# Patient Record
Sex: Male | Born: 1975 | Race: White | Hispanic: No | Marital: Single | Smoking: Current every day smoker
Health system: Southern US, Community
[De-identification: ages and names within clinical notes are randomized; demographics above are authoritative.]

## PROBLEM LIST (undated history)

## (undated) DIAGNOSIS — I8500 Esophageal varices without bleeding: Secondary | ICD-10-CM

## (undated) DIAGNOSIS — K7460 Unspecified cirrhosis of liver: Secondary | ICD-10-CM

## (undated) DIAGNOSIS — B1920 Unspecified viral hepatitis C without hepatic coma: Secondary | ICD-10-CM

## (undated) DIAGNOSIS — K219 Gastro-esophageal reflux disease without esophagitis: Secondary | ICD-10-CM

## (undated) DIAGNOSIS — B182 Chronic viral hepatitis C: Secondary | ICD-10-CM

## (undated) HISTORY — DX: Unspecified viral hepatitis C without hepatic coma: B19.20

## (undated) HISTORY — DX: Unspecified cirrhosis of liver: K74.60

## (undated) HISTORY — DX: Chronic viral hepatitis C: B18.2

## (undated) SURGERY — ESOPHAGOGASTRODUODENOSCOPY (EGD) WITH PROPOFOL
Anesthesia: Monitor Anesthesia Care

---

## 2011-02-05 DEATH — deceased

## 2014-01-08 ENCOUNTER — Other Ambulatory Visit: Payer: Self-pay | Admitting: Family Medicine

## 2014-01-08 DIAGNOSIS — R945 Abnormal results of liver function studies: Principal | ICD-10-CM

## 2014-01-08 DIAGNOSIS — R7989 Other specified abnormal findings of blood chemistry: Secondary | ICD-10-CM

## 2014-01-25 ENCOUNTER — Other Ambulatory Visit: Payer: Self-pay

## 2014-02-05 ENCOUNTER — Ambulatory Visit
Admission: RE | Admit: 2014-02-05 | Discharge: 2014-02-05 | Disposition: A | Discharge status: Home / Self Care | Payer: BC Managed Care – PPO | Source: Ambulatory Visit | Attending: Family Medicine | Admitting: Family Medicine

## 2014-02-05 DIAGNOSIS — R7989 Other specified abnormal findings of blood chemistry: Secondary | ICD-10-CM

## 2014-02-05 DIAGNOSIS — R945 Abnormal results of liver function studies: Principal | ICD-10-CM

## 2016-03-17 ENCOUNTER — Emergency Department (HOSPITAL_COMMUNITY): Payer: Self-pay

## 2016-03-17 ENCOUNTER — Inpatient Hospital Stay (HOSPITAL_COMMUNITY)
Admission: EM | Admit: 2016-03-17 | Discharge: 2016-03-19 | DRG: 369 | Disposition: A | Discharge status: Home / Self Care | Payer: Self-pay | Attending: Internal Medicine | Admitting: Internal Medicine

## 2016-03-17 ENCOUNTER — Encounter (HOSPITAL_COMMUNITY): Payer: Self-pay | Admitting: Emergency Medicine

## 2016-03-17 DIAGNOSIS — K3189 Other diseases of stomach and duodenum: Secondary | ICD-10-CM | POA: Diagnosis present

## 2016-03-17 DIAGNOSIS — B1920 Unspecified viral hepatitis C without hepatic coma: Secondary | ICD-10-CM | POA: Diagnosis present

## 2016-03-17 DIAGNOSIS — Z87891 Personal history of nicotine dependence: Secondary | ICD-10-CM

## 2016-03-17 DIAGNOSIS — D649 Anemia, unspecified: Secondary | ICD-10-CM | POA: Diagnosis present

## 2016-03-17 DIAGNOSIS — E871 Hypo-osmolality and hyponatremia: Secondary | ICD-10-CM | POA: Diagnosis present

## 2016-03-17 DIAGNOSIS — R188 Other ascites: Secondary | ICD-10-CM | POA: Diagnosis present

## 2016-03-17 DIAGNOSIS — R161 Splenomegaly, not elsewhere classified: Secondary | ICD-10-CM

## 2016-03-17 DIAGNOSIS — K766 Portal hypertension: Secondary | ICD-10-CM | POA: Diagnosis present

## 2016-03-17 DIAGNOSIS — D696 Thrombocytopenia, unspecified: Secondary | ICD-10-CM | POA: Diagnosis present

## 2016-03-17 DIAGNOSIS — K922 Gastrointestinal hemorrhage, unspecified: Secondary | ICD-10-CM | POA: Diagnosis present

## 2016-03-17 DIAGNOSIS — R162 Hepatomegaly with splenomegaly, not elsewhere classified: Secondary | ICD-10-CM | POA: Diagnosis present

## 2016-03-17 DIAGNOSIS — I8501 Esophageal varices with bleeding: Principal | ICD-10-CM | POA: Diagnosis present

## 2016-03-17 DIAGNOSIS — R112 Nausea with vomiting, unspecified: Secondary | ICD-10-CM

## 2016-03-17 DIAGNOSIS — R197 Diarrhea, unspecified: Secondary | ICD-10-CM

## 2016-03-17 DIAGNOSIS — K0889 Other specified disorders of teeth and supporting structures: Secondary | ICD-10-CM | POA: Diagnosis present

## 2016-03-17 DIAGNOSIS — D62 Acute posthemorrhagic anemia: Secondary | ICD-10-CM | POA: Diagnosis present

## 2016-03-17 DIAGNOSIS — K7460 Unspecified cirrhosis of liver: Secondary | ICD-10-CM | POA: Diagnosis present

## 2016-03-17 LAB — CBC
HCT: 24.7 % — ABNORMAL LOW (ref 39.0–52.0)
Hemoglobin: 7.8 g/dL — ABNORMAL LOW (ref 13.0–17.0)
MCH: 33.5 pg (ref 26.0–34.0)
MCHC: 31.6 g/dL (ref 30.0–36.0)
MCV: 106 fL — ABNORMAL HIGH (ref 78.0–100.0)
PLATELETS: 137 10*3/uL — AB (ref 150–400)
RBC: 2.33 MIL/uL — ABNORMAL LOW (ref 4.22–5.81)
RDW: 21.8 % — AB (ref 11.5–15.5)
WBC: 11.4 10*3/uL — ABNORMAL HIGH (ref 4.0–10.5)

## 2016-03-17 LAB — LIPASE, BLOOD: Lipase: 162 U/L — ABNORMAL HIGH (ref 11–51)

## 2016-03-17 LAB — SALICYLATE LEVEL: Salicylate Lvl: <7 mg/dL (ref 2.8–30.0)

## 2016-03-17 LAB — PROTIME-INR
INR: 1.27
Prothrombin Time: 16 seconds — ABNORMAL HIGH (ref 11.4–15.2)

## 2016-03-17 LAB — ACETAMINOPHEN LEVEL

## 2016-03-17 LAB — URINALYSIS, ROUTINE W REFLEX MICROSCOPIC
BILIRUBIN URINE: NEGATIVE
Glucose, UA: NEGATIVE mg/dL
HGB URINE DIPSTICK: NEGATIVE
Ketones, ur: NEGATIVE mg/dL
Leukocytes, UA: NEGATIVE
Nitrite: NEGATIVE
PROTEIN: NEGATIVE mg/dL
Specific Gravity, Urine: 1.02 (ref 1.005–1.030)
pH: 5.5 (ref 5.0–8.0)

## 2016-03-17 LAB — DIFFERENTIAL
Basophils Absolute: 0 10*3/uL (ref 0.0–0.1)
Basophils Relative: 0 %
EOS PCT: 1 %
Eosinophils Absolute: 0.1 10*3/uL (ref 0.0–0.7)
LYMPHS ABS: 1.6 10*3/uL (ref 0.7–4.0)
LYMPHS PCT: 14 %
MONO ABS: 1.5 10*3/uL — AB (ref 0.1–1.0)
Monocytes Relative: 13 %
NEUTROS ABS: 8.1 10*3/uL — AB (ref 1.7–7.7)
NEUTROS PCT: 72 %

## 2016-03-17 LAB — COMPREHENSIVE METABOLIC PANEL
ALBUMIN: 2.6 g/dL — AB (ref 3.5–5.0)
ALK PHOS: 71 U/L (ref 38–126)
ALT: 66 U/L — AB (ref 17–63)
AST: 49 U/L — ABNORMAL HIGH (ref 15–41)
Anion gap: 6 (ref 5–15)
BUN: 20 mg/dL (ref 6–20)
CALCIUM: 7.2 mg/dL — AB (ref 8.9–10.3)
CO2: 30 mmol/L (ref 22–32)
CREATININE: 0.98 mg/dL (ref 0.61–1.24)
Chloride: 93 mmol/L — ABNORMAL LOW (ref 101–111)
GFR calc non Af Amer: >60 mL/min (ref >60)
GLUCOSE: 161 mg/dL — AB (ref 65–99)
Potassium: 3.3 mmol/L — ABNORMAL LOW (ref 3.5–5.1)
SODIUM: 129 mmol/L — AB (ref 135–145)
Total Bilirubin: 1.8 mg/dL — ABNORMAL HIGH (ref 0.3–1.2)
Total Protein: 5.4 g/dL — ABNORMAL LOW (ref 6.5–8.1)

## 2016-03-17 LAB — ETHANOL

## 2016-03-17 LAB — ABO/RH: ABO/RH(D): B POS

## 2016-03-17 LAB — AMMONIA: AMMONIA: 17 umol/L (ref 9–35)

## 2016-03-17 LAB — PREPARE RBC (CROSSMATCH)

## 2016-03-17 MED ORDER — IOPAMIDOL (ISOVUE-300) INJECTION 61%
INTRAVENOUS | Status: AC
  Administered 2016-03-17: 30 mL
  Filled 2016-03-17: qty 30

## 2016-03-17 MED ORDER — PANTOPRAZOLE SODIUM 40 MG IV SOLR
40.0000 mg | Freq: Two times a day (BID) | INTRAVENOUS | Status: DC
  Administered 2016-03-18 (×2): 40 mg via INTRAVENOUS
  Filled 2016-03-17 (×2): qty 40

## 2016-03-17 MED ORDER — SODIUM CHLORIDE 0.9 % IV SOLN
INTRAVENOUS | Status: DC
  Administered 2016-03-17 – 2016-03-18 (×2): via INTRAVENOUS

## 2016-03-17 MED ORDER — BOOST / RESOURCE BREEZE PO LIQD
1.0000 | Freq: Three times a day (TID) | ORAL | Status: DC
  Administered 2016-03-18 (×2): 1 via ORAL

## 2016-03-17 MED ORDER — ACETAMINOPHEN 650 MG RE SUPP: 650.0000 mg | Freq: Four times a day (QID) | RECTAL | Status: DC | PRN

## 2016-03-17 MED ORDER — IOPAMIDOL (ISOVUE-300) INJECTION 61%
100.0000 mL | Freq: Once | INTRAVENOUS | Status: AC | PRN
  Administered 2016-03-17: 100 mL via INTRAVENOUS

## 2016-03-17 MED ORDER — PANTOPRAZOLE SODIUM 40 MG IV SOLR: 40.0000 mg | Freq: Two times a day (BID) | INTRAVENOUS | Status: DC

## 2016-03-17 MED ORDER — ACETAMINOPHEN 325 MG PO TABS: 650.0000 mg | ORAL_TABLET | Freq: Four times a day (QID) | ORAL | Status: DC | PRN

## 2016-03-17 MED ORDER — SODIUM CHLORIDE 0.9 % IV SOLN
80.0000 mg | Freq: Once | INTRAVENOUS | Status: AC
  Administered 2016-03-17 (×2): 80 mg via INTRAVENOUS
  Filled 2016-03-17: qty 80

## 2016-03-17 MED ORDER — SODIUM CHLORIDE 0.9 % IV SOLN
8.0000 mg/h | INTRAVENOUS | Status: DC
  Administered 2016-03-17: 8 mg/h via INTRAVENOUS
  Filled 2016-03-17 (×5): qty 80

## 2016-03-17 MED ORDER — ONDANSETRON HCL 4 MG/2ML IJ SOLN: 4.0000 mg | Freq: Four times a day (QID) | INTRAMUSCULAR | Status: DC | PRN

## 2016-03-17 MED ORDER — SODIUM CHLORIDE 0.9 % IV SOLN
INTRAVENOUS | Status: AC
  Administered 2016-03-18: 02:00:00 via INTRAVENOUS

## 2016-03-17 MED ORDER — SODIUM CHLORIDE 0.9 % IV BOLUS (SEPSIS)
1000.0000 mL | Freq: Once | INTRAVENOUS | Status: AC
  Administered 2016-03-17: 1000 mL via INTRAVENOUS

## 2016-03-17 MED ORDER — DICYCLOMINE HCL 10 MG/ML IM SOLN
20.0000 mg | Freq: Once | INTRAMUSCULAR | Status: AC
  Administered 2016-03-17: 20 mg via INTRAMUSCULAR
  Filled 2016-03-17: qty 2

## 2016-03-17 MED ORDER — PANTOPRAZOLE SODIUM 40 MG IV SOLR
INTRAVENOUS | Status: AC
Start: 2016-03-17 — End: 2016-03-17
  Filled 2016-03-17: qty 80

## 2016-03-17 MED ORDER — ONDANSETRON HCL 4 MG PO TABS: 4.0000 mg | ORAL_TABLET | Freq: Four times a day (QID) | ORAL | Status: DC | PRN

## 2016-03-17 MED ORDER — SODIUM CHLORIDE 0.9% FLUSH
3.0000 mL | Freq: Two times a day (BID) | INTRAVENOUS | Status: DC
  Administered 2016-03-18 (×2): 3 mL via INTRAVENOUS

## 2016-03-17 MED ORDER — ONDANSETRON HCL 4 MG/2ML IJ SOLN: 4.0000 mg | Freq: Three times a day (TID) | INTRAMUSCULAR | Status: AC | PRN

## 2016-03-17 NOTE — ED Notes (Signed)
Pt refusing second IV start, after one failed attempt. Explained to patient that he needed a second IV because he needs blood products and Protonix gtt which can not run together. Pt still refusing second IV at this time. Pt uncooperative and agitated. Will check back

## 2016-03-17 NOTE — H&P (Signed)
History and Physical  Benjamin MareDavid Rooks QMV:[id number] DOB: 1975-12-19 DOA: 03/17/2016  Referring physician: Dr Clarene DukeMcManus, ED physician [facility]  Outpatient Specialists: none  Chief Complaint: Abdominal distention  HPI: Benjamin MareDavid Miller is a 40 y.o. male with a possible history of hepatitis C who's been having abdominal distention for 10 days with nausea and vomiting that started a week ago. The patient took Pepto-Bismol intimately threw it up and had diarrhea. He had diarrhea for 2-3 days that was black in color. The patient took Imodium which resolved his diarrhea and the dark stools. He continues to have abdominal distention and came to the hospital with his wife. No other palliating or provoking factors. He denies nausea, vomiting, diarrhea currently. No fevers or chills. Denies alcohol use. He does not take any medications. He does relate that at one point he was diagnosed with hepatitis C, although as this was never confirmed and he has never been treated.  Emergency Department Course: Patient remained without vomiting or diarrhea in the ER. He does have mildly elevated transaminases with a CT that shows hepatosplenomegaly with moderate ascites and secondary signs of possible portal hypertension. Was also found to be anemic with a hemoglobin of 7.8. No active hematemesis or melena.  Review of Systems:   Pt denies any fevers, chills, nausea, vomiting, diarrhea, constipation, abdominal pain, shortness of breath, dyspnea on exertion, orthopnea, cough, wheezing, palpitations, headache, vision changes, lightheadedness, dizziness, melena, rectal bleeding.  Review of systems are otherwise negative  History reviewed. No pertinent past medical history. History reviewed. No pertinent surgical history. Social History:  reports that he has quit smoking. He has quit using smokeless tobacco. He reports that he does not drink alcohol. His drug history is not on file. Patient lives at  home  No Known Allergies  No family history on file.    Prior to Admission medications   Medication Sig Start Date End Date Taking? Authorizing Provider  aspirin-sod bicarb-citric acid (ALKA-SELTZER) 325 MG TBEF tablet Take 325 mg by mouth every 6 (six) hours as needed.   Yes Historical Provider, MD    Physical Exam: BP 124/72 (BP Location: Left Arm)   Pulse 93   Temp 98.6 F (37 C) (Oral)   Resp 18   Ht 5' 11.5" (1.816 m)   Wt 81.6 kg (180 lb)   SpO2 99%   BMI 24.76 kg/m   General: Middle-aged Caucasian male. Awake and alert and oriented x3. No acute cardiopulmonary distress.  HEENT: Normocephalic atraumatic.  Right and left ears normal in appearance.  Pupils equal, round, reactive to light. Extraocular muscles are intact. Sclerae anicteric and noninjected.  Moist mucosal membranes. No mucosal lesions.  Neck: Neck supple without lymphadenopathy. No carotid bruits. No masses palpated.  Cardiovascular: Regular rate with normal S1-S2 sounds. No murmurs, rubs, gallops auscultated. No JVD.  Respiratory: Good respiratory effort with no wheezes, rales, rhonchi. Lungs clear to auscultation bilaterally.  No accessory muscle use. Abdomen: Soft, nontender. Abdomen distended. hepatosplenomegaly . Bowel sounds appropriate Skin: Pale. No rashes, lesions, or ulcerations.  Dry, warm to touch. 2+ dorsalis pedis and radial pulses. Musculoskeletal: No calf or leg pain. All major joints not erythematous nontender.  No upper or lower joint deformation.  Good ROM.  No contractures  Psychiatric: Intact judgment and insight. Pleasant and cooperative. Neurologic: No focal neurological deficits. Strength is 5/5 and symmetric in upper and lower extremities.  Cranial nerves II through XII are grossly intact.  Labs on Admission: I have personally reviewed following labs and imaging studies  CBC:  Recent Labs Lab 03/17/16 1613  WBC 11.4*  NEUTROABS 8.1*  HGB 7.8*  HCT 24.7*  MCV 106.0*    PLT 137*   Basic Metabolic Panel:  Recent Labs Lab 03/17/16 1613  NA 129*  K 3.3*  CL 93*  CO2 30  GLUCOSE 161*  BUN 20  CREATININE 0.98  CALCIUM 7.2*   GFR: Estimated Creatinine Clearance: 107.3 mL/min (by C-G formula based on SCr of 0.98 mg/dL). Liver Function Tests:  Recent Labs Lab 03/17/16 1613  AST 49*  ALT 66*  ALKPHOS 71  BILITOT 1.8*  PROT 5.4*  ALBUMIN 2.6*    Recent Labs Lab 03/17/16 1613  LIPASE 162*    Recent Labs Lab 03/17/16 1856  AMMONIA 17   Coagulation Profile:  Recent Labs Lab 03/17/16 1856  INR 1.27   Cardiac Enzymes: No results for input(s): CKTOTAL, CKMB, CKMBINDEX, TROPONINI in the last 168 hours. BNP (last 3 results) No results for input(s): PROBNP in the last 8760 hours. HbA1C: No results for input(s): HGBA1C in the last 72 hours. CBG: No results for input(s): GLUCAP in the last 168 hours. Lipid Profile: No results for input(s): CHOL, HDL, LDLCALC, TRIG, CHOLHDL, LDLDIRECT in the last 72 hours. Thyroid Function Tests: No results for input(s): TSH, T4TOTAL, FREET4, T3FREE, THYROIDAB in the last 72 hours. Anemia Panel: No results for input(s): VITAMINB12, FOLATE, FERRITIN, TIBC, IRON, RETICCTPCT in the last 72 hours. Urine analysis:    Component Value Date/Time   COLORURINE YELLOW 03/17/2016 1517   APPEARANCEUR HAZY (A) 03/17/2016 1517   LABSPEC 1.020 03/17/2016 1517   PHURINE 5.5 03/17/2016 1517   GLUCOSEU NEGATIVE 03/17/2016 1517   HGBUR NEGATIVE 03/17/2016 1517   BILIRUBINUR NEGATIVE 03/17/2016 1517   KETONESUR NEGATIVE 03/17/2016 1517   PROTEINUR NEGATIVE 03/17/2016 1517   NITRITE NEGATIVE 03/17/2016 1517   LEUKOCYTESUR NEGATIVE 03/17/2016 1517   Sepsis Labs: @LABRCNTIP (procalcitonin:4,lacticidven:4) )No results found for this or any previous visit (from the past 240 hour(s)).   Radiological Exams on Admission: Dg Chest 2 View  Result Date: 03/17/2016 CLINICAL DATA:  Patient with the flu.  Abdominal  distension. EXAM: CHEST  2 VIEW COMPARISON:  None. FINDINGS: The heart size and mediastinal contours are within normal limits. Both lungs are clear. The visualized skeletal structures are unremarkable. IMPRESSION: No active cardiopulmonary disease. Electronically Signed   By: Annia Belt M.D.   On: 03/17/2016 18:11   Ct Abdomen Pelvis W Contrast  Result Date: 03/17/2016 CLINICAL DATA:  Patient with diarrhea, vomiting. History of implanted. EXAM: CT ABDOMEN AND PELVIS WITH CONTRAST TECHNIQUE: Multidetector CT imaging of the abdomen and pelvis was performed using the standard protocol following bolus administration of intravenous contrast. CONTRAST:  30mL ISOVUE-300 IOPAMIDOL (ISOVUE-300) INJECTION 61%, ISOVUE-300 IOPAMIDOL (ISOVUE-300) INJECTION 61% COMPARISON:  None. FINDINGS: Lower chest: Normal heart size. No consolidative opacities within the lower lobes bilaterally. No pleural effusion. Hepatobiliary: Liver is small and nodular in contour compatible with cirrhosis. Too small to characterize sub cm low-attenuation lesion within the left hepatic lobe (image 15; series 2). Wall thickening of the gallbladder. No intrahepatic or extrahepatic biliary ductal dilatation. Pancreas: Unremarkable Spleen: Spleen is enlarged measuring 14 cm. Adrenals/Urinary Tract: The adrenal glands are normal. There is a 1.9 cm cyst within the interpolar region the right kidney. Urinary bladder is unremarkable. Stomach/Bowel: Oral contrast material is demonstrated throughout the small and large bowel without evidence for obstruction. There is suggestion  of wall thickening of the small bowel as well as ascending colon, potentially secondary to portal colopathy and/or low albumin. Vascular/Lymphatic: Normal caliber abdominal aorta. No retroperitoneal lymphadenopathy. Reproductive: Prostate is mildly enlarged. Other: There is moderate volume ascites throughout the abdomen. Musculoskeletal: No aggressive or acute appearing osseous  lesions. IMPRESSION: Findings most compatible with hepatic cirrhosis and associated splenomegaly. There is moderate to large volume ascites throughout the abdomen. Wall thickening of the small bowel and ascending colon is likely secondary to portal hypertension and/or low albumin. Gallbladder wall thickening likely secondary to hepatic parenchymal disease. Recommend correlation with LFTs is clinically indicated. Electronically Signed   By: Annia Beltrew  Davis M.D.   On: 03/17/2016 18:21    Assessment/Plan: Principal Problem:   Acute upper GI bleeding Active Problems:   Cirrhosis of liver with ascites (HCC)   Symptomatic anemia   Hyponatremia   Portal hypertension (HCC)    This patient was discussed with the ED physician, including pertinent vitals, physical exam findings, labs, and imaging.  We also discussed care given by the ED provider.  #1 upper GI bleed  Admit to stepdown  May have esophageal varices if he has oral hypertension  Type and crossed matched  Protonix ordered  GI consulted  We'll keep on clears #2 symptomatic anemia  Transfuse 1 unit with 2 units on hold #3 cirrhosis of liver with ascites  Cirrhosis workup in process  Ultrasound tomorrow #4 hyponatremia  Secondary to cirrhosis #5 portal hypertension   DVT prophylaxis: SCDs Consultants: GI Code Status: Full code Family Communication: Wife in the room  Disposition Plan: Patient to return home following admission   Levie HeritageJacob J Stinson, DO [facility] Pager [phone]  If 7PM-7AM, please contact night-coverage [email or web address] Password [email or web address]

## 2016-03-17 NOTE — ED Provider Notes (Signed)
[facility]-EMERGENCY DEPT Provider Note   CSN: [id number] Arrival date & time: 03/17/16  1506     History   Chief Complaint Chief Complaint  Patient presents with  . Abdominal Pain    HPI Benjamin Miller is a 40 y.o. male.  HPI  Pt was seen at 1625. Per pt, c/o gradual onset and persistence of multiple intermittent episodes of N/V/D that began 2 weeks ago.   Describes the emesis and stools as "black." Has been associated with generalized abd "pain" for the past 3 days. States the abd pain started "after I took imodium" but "the vomiting and diarrhea stopped." States he took pepto bismol, but is unsure if his emesis and stools were "black" before he took it. Denies CP/SOB, no back pain, no fevers.      History reviewed. No pertinent past medical history.  There are no active problems to display for this patient.   History reviewed. No pertinent surgical history.     Home Medications    Prior to Admission medications   Not on File    Family History No family history on file.  Social History Social History  Substance Use Topics  . Smoking status: Former Games developermoker  . Smokeless tobacco: Former NeurosurgeonUser  . Alcohol use No     Allergies   Patient has no known allergies.   Review of Systems Review of Systems ROS: Statement: All systems negative except as marked or noted in the HPI; Constitutional: Negative for fever and chills. ; ; Eyes: Negative for eye pain, redness and discharge. ; ; ENMT: Negative for ear pain, hoarseness, nasal congestion, sinus pressure and sore throat. ; ; Cardiovascular: Negative for chest pain, palpitations, diaphoresis, dyspnea and peripheral edema. ; ; Respiratory: Negative for cough, wheezing and stridor. ; ; Gastrointestinal: +N/V/D, abd pain, "black vomit and stools." Negative for blood in stool, jaundice and rectal bleeding. . ; ; Genitourinary: Negative for dysuria, flank pain and hematuria. ; ; Musculoskeletal: Negative for back pain and neck pain.  Negative for swelling and trauma.; ; Skin: Negative for pruritus, rash, abrasions, blisters, bruising and skin lesion.; ; Neuro: Negative for headache, lightheadedness and neck stiffness. Negative for weakness, altered level of consciousness, altered mental status, extremity weakness, paresthesias, involuntary movement, seizure and syncope.       Physical Exam Updated Vital Signs BP 142/84 (BP Location: Left Arm)   Pulse 116   Temp 98.6 F (37 C) (Oral)   Resp 16   Ht 5' 11.5" (1.816 m)   Wt 180 lb (81.6 kg)   SpO2 100%   BMI 24.76 kg/m   Physical Exam 1630: Physical examination:  Nursing notes reviewed; Vital signs and O2 SAT reviewed;  Constitutional: Well developed, Well nourished, Well hydrated, In no acute distress; Head:  Normocephalic, atraumatic; Eyes: EOMI, PERRL, No scleral icterus. Conjunctiva pale; ENMT: Mouth and pharynx normal, Mucous membranes moist; Neck: Supple, Full range of motion, No lymphadenopathy; Cardiovascular: Regular rate and rhythm, No gallop; Respiratory: Breath sounds clear & equal bilaterally, No wheezes.  Speaking full sentences with ease, Normal respiratory effort/excursion; Chest: Nontender, Movement normal; Abdomen:  Non-tender. No rebound or guarding. +softly distended, Normal bowel sounds. Rectal exam performed w/permission of pt and ED RN chaperone present.  Anal tone normal.  Non-tender, scant stool in rectal vault, heme neg.  No fissures, no external hemorrhoids, no palp masses.; Genitourinary: No CVA tenderness; Extremities: Pulses normal, No tenderness, No edema, No calf edema or asymmetry.; Neuro: AA&Ox3, Major CN grossly intact.  Speech  clear. No gross focal motor or sensory deficits in extremities.; Skin: Color pale, Warm, Dry.    ED Treatments / Results  Labs (all labs ordered are listed, but only abnormal results are displayed)   EKG  EKG Interpretation None       Radiology   Procedures Procedures (including critical care  time)  Medications Ordered in ED Medications  dicyclomine (BENTYL) injection 20 mg (20 mg Intramuscular Given 03/17/16 1652)  iopamidol (ISOVUE-300) 61 % injection (30 mLs  Contrast Given 03/17/16 1645)     Initial Impression / Assessment and Plan / ED Course  I have reviewed the triage vital signs and the nursing notes.  Pertinent labs & imaging results that were available during my care of the patient were reviewed by me and considered in my medical decision making (see chart for details).  MDM Reviewed: nursing note and vitals Interpretation: labs, x-ray and CT scan Total time providing critical care: 30-74 minutes. This excludes time spent performing separately reportable procedures and services. Consults: admitting MD    CRITICAL CARE Performed by: Laray Anger Total critical care time: 35 minutes Critical care time was exclusive of separately billable procedures and treating other patients. Critical care was necessary to treat or prevent imminent or life-threatening deterioration. Critical care was time spent personally by me on the following activities: development of treatment plan with patient and/or surrogate as well as nursing, discussions with consultants, evaluation of patient's response to treatment, examination of patient, obtaining history from patient or surrogate, ordering and performing treatments and interventions, ordering and review of laboratory studies, ordering and review of radiographic studies, pulse oximetry and re-evaluation of patient's condition.    Results for orders placed or performed during the hospital encounter of 03/17/16  Lipase, blood  Result Value Ref Range   Lipase 162 (H) 11 - 51 U/L  Comprehensive metabolic panel  Result Value Ref Range   Sodium 129 (L) 135 - 145 mmol/L   Potassium 3.3 (L) 3.5 - 5.1 mmol/L   Chloride 93 (L) 101 - 111 mmol/L   CO2 30 22 - 32 mmol/L   Glucose, Bld 161 (H) 65 - 99 mg/dL   BUN 20 6 - 20 mg/dL    Creatinine, Ser 1.61 0.61 - 1.24 mg/dL   Calcium 7.2 (L) 8.9 - 10.3 mg/dL   Total Protein 5.4 (L) 6.5 - 8.1 g/dL   Albumin 2.6 (L) 3.5 - 5.0 g/dL   AST 49 (H) 15 - 41 U/L   ALT 66 (H) 17 - 63 U/L   Alkaline Phosphatase 71 38 - 126 U/L   Total Bilirubin 1.8 (H) 0.3 - 1.2 mg/dL   GFR calc non Af Amer >60 >60 mL/min   GFR calc Af Amer >60 >60 mL/min   Anion gap 6 5 - 15  CBC  Result Value Ref Range   WBC 11.4 (H) 4.0 - 10.5 K/uL   RBC 2.33 (L) 4.22 - 5.81 MIL/uL   Hemoglobin 7.8 (L) 13.0 - 17.0 g/dL   HCT 09.6 (L) 04.5 - 40.9 %   MCV 106.0 (H) 78.0 - 100.0 fL   MCH 33.5 26.0 - 34.0 pg   MCHC 31.6 30.0 - 36.0 g/dL   RDW 81.1 (H) 91.4 - 78.2 %   Platelets 137 (L) 150 - 400 K/uL  Urinalysis, Routine w reflex microscopic  Result Value Ref Range   Color, Urine YELLOW YELLOW   APPearance HAZY (A) CLEAR   Specific Gravity, Urine 1.020 1.005 - 1.030  pH 5.5 5.0 - 8.0   Glucose, UA NEGATIVE NEGATIVE mg/dL   Hgb urine dipstick NEGATIVE NEGATIVE   Bilirubin Urine NEGATIVE NEGATIVE   Ketones, ur NEGATIVE NEGATIVE mg/dL   Protein, ur NEGATIVE NEGATIVE mg/dL   Nitrite NEGATIVE NEGATIVE   Leukocytes, UA NEGATIVE NEGATIVE  Differential  Result Value Ref Range   Neutrophils Relative % 72 %   Neutro Abs 8.1 (H) 1.7 - 7.7 K/uL   Lymphocytes Relative 14 %   Lymphs Abs 1.6 0.7 - 4.0 K/uL   Monocytes Relative 13 %   Monocytes Absolute 1.5 (H) 0.1 - 1.0 K/uL   Eosinophils Relative 1 %   Eosinophils Absolute 0.1 0.0 - 0.7 K/uL   Basophils Relative 0 %   Basophils Absolute 0.0 0.0 - 0.1 K/uL    Dg Chest 2 View Result Date: 03/17/2016 CLINICAL DATA:  Patient with the flu.  Abdominal distension. EXAM: CHEST  2 VIEW COMPARISON:  None. FINDINGS: The heart size and mediastinal contours are within normal limits. Both lungs are clear. The visualized skeletal structures are unremarkable. IMPRESSION: No active cardiopulmonary disease. Electronically Signed   By: Annia Belt M.D.   On: 03/17/2016 18:11    Ct Abdomen Pelvis W Contrast Result Date: 03/17/2016 CLINICAL DATA:  Patient with diarrhea, vomiting. History of implanted. EXAM: CT ABDOMEN AND PELVIS WITH CONTRAST TECHNIQUE: Multidetector CT imaging of the abdomen and pelvis was performed using the standard protocol following bolus administration of intravenous contrast. CONTRAST:  30mL ISOVUE-300 IOPAMIDOL (ISOVUE-300) INJECTION 61%, ISOVUE-300 IOPAMIDOL (ISOVUE-300) INJECTION 61% COMPARISON:  None. FINDINGS: Lower chest: Normal heart size. No consolidative opacities within the lower lobes bilaterally. No pleural effusion. Hepatobiliary: Liver is small and nodular in contour compatible with cirrhosis. Too small to characterize sub cm low-attenuation lesion within the left hepatic lobe (image 15; series 2). Wall thickening of the gallbladder. No intrahepatic or extrahepatic biliary ductal dilatation. Pancreas: Unremarkable Spleen: Spleen is enlarged measuring 14 cm. Adrenals/Urinary Tract: The adrenal glands are normal. There is a 1.9 cm cyst within the interpolar region the right kidney. Urinary bladder is unremarkable. Stomach/Bowel: Oral contrast material is demonstrated throughout the small and large bowel without evidence for obstruction. There is suggestion of wall thickening of the small bowel as well as ascending colon, potentially secondary to portal colopathy and/or low albumin. Vascular/Lymphatic: Normal caliber abdominal aorta. No retroperitoneal lymphadenopathy. Reproductive: Prostate is mildly enlarged. Other: There is moderate volume ascites throughout the abdomen. Musculoskeletal: No aggressive or acute appearing osseous lesions. IMPRESSION: Findings most compatible with hepatic cirrhosis and associated splenomegaly. There is moderate to large volume ascites throughout the abdomen. Wall thickening of the small bowel and ascending colon is likely secondary to portal hypertension and/or low albumin. Gallbladder wall thickening likely  secondary to hepatic parenchymal disease. Recommend correlation with LFTs is clinically indicated. Electronically Signed   By: Annia Belt M.D.   On: 03/17/2016 18:21    1850:  H/H low; no old to compare, will transfuse PRBC. No acute bleeding/vomiting/diarrhea while in the ED; will start IV Protonix gtt. Pt now states he "was told about a year ago that I had Hepatitis C;" states he did not follow through on further testing or treatment (Harvoni). Dx and testing d/w pt and family.  Questions answered.  Verb understanding, agreeable to admit.  T/C to [facility]. Adrian Blackwater, case discussed, including:  HPI, pertinent PM/SHx, VS/PE, dx testing, ED course and treatment:  Agreeable to admit, requests to write temporary orders, obtain stepdown bed to team  [facility]mits. T/C to GI Dr. [name], case discussed, including:  HPI, pertinent PM/SHx, VS/PE, dx testing, ED course and treatment:  Agreeable to consult tomorrow.    Final Clinical Impressions(s) / ED Diagnoses   Final diagnoses:  None    New Prescriptions New Prescriptions   No medications on file     Samuel JesterKathleen Murriel Eidem, DO 03/21/16 1042

## 2016-03-17 NOTE — ED Notes (Signed)
[facility] all questions answered. [name] will call back when okay to physically transfer patient

## 2016-03-17 NOTE — ED Notes (Signed)
Hemocult negative, edp aware.

## 2016-03-17 NOTE — ED Notes (Signed)
IV placed in r ac because of CT scan.

## 2016-03-17 NOTE — ED Notes (Signed)
Per Dr. Adrian BlackwaterStinson okay to d/c Protonix gtt, gtt discont bfore patient transferred to ICU. Patient transferred to ICU room 1 a this time, Benjamin NixonJanice accepting nurse in room with patient upon my leaving.

## 2016-03-17 NOTE — ED Triage Notes (Signed)
Pt states had the flu 10/31 and then began having diarrhea, vomiting, and gas on 11/6.  Pt states that the vomit was black.  He has been taking immodium, theraflu, and alka seltzer.

## 2016-03-18 ENCOUNTER — Inpatient Hospital Stay (HOSPITAL_COMMUNITY): Payer: Self-pay

## 2016-03-18 DIAGNOSIS — K766 Portal hypertension: Secondary | ICD-10-CM

## 2016-03-18 DIAGNOSIS — K922 Gastrointestinal hemorrhage, unspecified: Secondary | ICD-10-CM

## 2016-03-18 DIAGNOSIS — D649 Anemia, unspecified: Secondary | ICD-10-CM

## 2016-03-18 DIAGNOSIS — E871 Hypo-osmolality and hyponatremia: Secondary | ICD-10-CM

## 2016-03-18 LAB — MRSA PCR SCREENING: MRSA by PCR: NEGATIVE

## 2016-03-18 LAB — COMPREHENSIVE METABOLIC PANEL
ALBUMIN: 2.1 g/dL — AB (ref 3.5–5.0)
ALT: 47 U/L (ref 17–63)
ANION GAP: 5 (ref 5–15)
AST: 34 U/L (ref 15–41)
Alkaline Phosphatase: 51 U/L (ref 38–126)
BILIRUBIN TOTAL: 2.6 mg/dL — AB (ref 0.3–1.2)
BUN: 15 mg/dL (ref 6–20)
CO2: 27 mmol/L (ref 22–32)
Calcium: 6.6 mg/dL — ABNORMAL LOW (ref 8.9–10.3)
Chloride: 96 mmol/L — ABNORMAL LOW (ref 101–111)
Creatinine, Ser: 0.82 mg/dL (ref 0.61–1.24)
GFR calc Af Amer: >60 mL/min (ref >60)
GFR calc non Af Amer: >60 mL/min (ref >60)
GLUCOSE: 107 mg/dL — AB (ref 65–99)
POTASSIUM: 3.3 mmol/L — AB (ref 3.5–5.1)
SODIUM: 128 mmol/L — AB (ref 135–145)
Total Protein: 4.1 g/dL — ABNORMAL LOW (ref 6.5–8.1)

## 2016-03-18 LAB — CBC WITH DIFFERENTIAL/PLATELET
Basophils Absolute: 0 10*3/uL (ref 0.0–0.1)
Basophils Relative: 0 %
EOS ABS: 0.2 10*3/uL (ref 0.0–0.7)
EOS PCT: 2 %
HCT: 26 % — ABNORMAL LOW (ref 39.0–52.0)
Hemoglobin: 8.5 g/dL — ABNORMAL LOW (ref 13.0–17.0)
LYMPHS ABS: 2 10*3/uL (ref 0.7–4.0)
LYMPHS PCT: 13 %
MCH: 32.7 pg (ref 26.0–34.0)
MCHC: 32.7 g/dL (ref 30.0–36.0)
MCV: 100 fL (ref 78.0–100.0)
MONOS PCT: 6 %
Monocytes Absolute: 0.9 10*3/uL (ref 0.1–1.0)
Neutro Abs: 11.7 10*3/uL — ABNORMAL HIGH (ref 1.7–7.7)
Neutrophils Relative %: 79 %
PLATELETS: 100 10*3/uL — AB (ref 150–400)
RBC: 2.6 MIL/uL — ABNORMAL LOW (ref 4.22–5.81)
RDW: 22.9 % — ABNORMAL HIGH (ref 11.5–15.5)
WBC: 14.8 10*3/uL — ABNORMAL HIGH (ref 4.0–10.5)

## 2016-03-18 MED ORDER — MORPHINE SULFATE (PF) 2 MG/ML IV SOLN
2.0000 mg | INTRAVENOUS | Status: DC | PRN
  Administered 2016-03-18 – 2016-03-19 (×3): 2 mg via INTRAVENOUS
  Filled 2016-03-18 (×3): qty 1

## 2016-03-18 MED ORDER — FUROSEMIDE 10 MG/ML IJ SOLN
40.0000 mg | Freq: Every day | INTRAMUSCULAR | Status: DC
  Administered 2016-03-18: 40 mg via INTRAVENOUS
  Filled 2016-03-18: qty 4

## 2016-03-18 MED ORDER — POTASSIUM CHLORIDE CRYS ER 20 MEQ PO TBCR
40.0000 meq | EXTENDED_RELEASE_TABLET | Freq: Two times a day (BID) | ORAL | Status: AC
  Administered 2016-03-18 (×2): 40 meq via ORAL
  Filled 2016-03-18 (×2): qty 2

## 2016-03-18 MED ORDER — SPIRONOLACTONE 25 MG PO TABS
25.0000 mg | ORAL_TABLET | Freq: Every day | ORAL | Status: DC
  Administered 2016-03-18: 25 mg via ORAL
  Filled 2016-03-18: qty 1

## 2016-03-18 NOTE — Progress Notes (Signed)
[facility] to [facility]. PT taken via wheelchair, stable in no distress, no complaints. Wife at side.

## 2016-03-18 NOTE — Progress Notes (Signed)
Patient having complaints of abdominal discomfort; abdomen tight and distended, hospitalist notified. Client did report eating small meal of 1/2 baked potato and small amount of grilled chicken.

## 2016-03-18 NOTE — Progress Notes (Signed)
PROGRESS NOTE    Benjamin MareDavid Miller  JXB:[id number] DOB: 12/03/1975 DOA: 03/17/2016 [facility]    Brief Narrative:  40 y.o. male with a possible history of hepatitis C who's been having abdominal distention for 10 days with nausea and vomiting that started a week ago. The patient took Pepto-Bismol intimately threw it up and had diarrhea. He had diarrhea for 2-3 days that was black in color. The patient took Imodium which resolved his diarrhea and the dark stools. He continues to have abdominal distention and came to the hospital with his wife. No other palliating or provoking factors. He denies nausea, vomiting, diarrhea currently. No fevers or chills. Denies alcohol use. He does not take any medications. He does relate that at one point he was diagnosed with hepatitis C, although as this was never confirmed and he has never been treated  Assessment & Plan:   Principal Problem:   Acute upper GI bleeding Active Problems:   Cirrhosis of liver with ascites (HCC)   Symptomatic anemia   Hyponatremia   Portal hypertension (HCC)  #1 upper GI bleed  Protonix ordered  GI consulted. Recommendations for 2 g sodium pressure to diet. Recommendations to continue patient on direct reticulocyte therapy. Per GI, no need for urgent paracentesis at this time.  GI is considering EGD to screen for varices and to evaluate recent vomiting history.  Hepatitis C workup in progress  Repeat CBC in the morning #2 symptomatic anemia  Patient was given 1 unit with 2 units on hold  Hemoglobin this morning stable at 8.5 #3 cirrhosis of liver with ascites  CT and abdominal ultrasound reviewed  Imaging findings suggestive of cirrhosis with moderate ascites  Per above, no need for urgent paracentesis at this time per GI #4 hyponatremia  Likely secondary to volume overload from cirrhosis  Continue to monitor Her hands of metabolic panel morning #5 portal hypertension  Per above,  continue diuretic therapy  DVT prophylaxis: SCDs Code Status: Full code Family Communication: Patient in room, family at bedside Disposition Plan: Uncertain at this time  Consultants:   Gastroenterology  Procedures:     Antimicrobials: Anti-infectives    None       Subjective: Complains of abdominal "tightness".  Objective: Vitals:   03/18/16 0700 03/18/16 0715 03/18/16 0800 03/18/16 1418  BP:   122/72 112/68  Pulse: 96  91 88  Resp: 15  20 18   Temp:  98.9 F (37.2 C)  97.8 F (36.6 C)  TempSrc:    Oral  SpO2: 100%  100% 100%  Weight:      Height:        Intake/Output Summary (Last 24 hours) at 03/18/16 1543 Last data filed at 03/18/16 0900  Gross per 24 hour  Intake          3126.67 ml  Output                0 ml  Net          3126.67 ml   Filed Weights   03/17/16 1515 03/17/16 2154  Weight: 81.6 kg (180 lb) 84.3 kg (185 lb 13.6 oz)    Examination:  General exam: Appears calm and comfortable , Ambulating in room Respiratory system: Clear to auscultation. Respiratory effort normal. Cardiovascular system: S1 & S2 heard, RRR. No JVD, murmurs, rubs, gallops or clicks. No pedal edema. Gastrointestinal system: Abdomen distended, positive bowel sounds, generally tender Central nervous system: Alert and oriented. No focal neurological deficits.  Extremities: Symmetric 5 x 5 power. Skin: No rashes, lesions Psychiatry: Judgement and insight appear normal. Mood & affect appropriate.   Data Reviewed: I have personally reviewed following labs and imaging studies  CBC:  Recent Labs Lab 03/17/16 1613 03/18/16 0910  WBC 11.4* 14.8*  NEUTROABS 8.1* 11.7*  HGB 7.8* 8.5*  HCT 24.7* 26.0*  MCV 106.0* 100.0  PLT 137* 100*   Basic Metabolic Panel:  Recent Labs Lab 03/17/16 1613 03/18/16 0505  NA 129* 128*  K 3.3* 3.3*  CL 93* 96*  CO2 30 27  GLUCOSE 161* 107*  BUN 20 15  CREATININE 0.98 0.82  CALCIUM 7.2* 6.6*   GFR: Estimated Creatinine  Clearance: 130.1 mL/min (by C-G formula based on SCr of 0.82 mg/dL). Liver Function Tests:  Recent Labs Lab 03/17/16 1613 03/18/16 0505  AST 49* 34  ALT 66* 47  ALKPHOS 71 51  BILITOT 1.8* 2.6*  PROT 5.4* 4.1*  ALBUMIN 2.6* 2.1*    Recent Labs Lab 03/17/16 1613  LIPASE 162*    Recent Labs Lab 03/17/16 1856  AMMONIA 17   Coagulation Profile:  Recent Labs Lab 03/17/16 1856  INR 1.27   Cardiac Enzymes: No results for input(s): CKTOTAL, CKMB, CKMBINDEX, TROPONINI in the last 168 hours. BNP (last 3 results) No results for input(s): PROBNP in the last 8760 hours. HbA1C: No results for input(s): HGBA1C in the last 72 hours. CBG: No results for input(s): GLUCAP in the last 168 hours. Lipid Profile: No results for input(s): CHOL, HDL, LDLCALC, TRIG, CHOLHDL, LDLDIRECT in the last 72 hours. Thyroid Function Tests: No results for input(s): TSH, T4TOTAL, FREET4, T3FREE, THYROIDAB in the last 72 hours. Anemia Panel: No results for input(s): VITAMINB12, FOLATE, FERRITIN, TIBC, IRON, RETICCTPCT in the last 72 hours. Sepsis Labs: No results for input(s): PROCALCITON, LATICACIDVEN in the last 168 hours.  Recent Results (from the past 240 hour(s))  MRSA PCR Screening     Status: None   Collection Time: 03/17/16 10:20 PM  Result Value Ref Range Status   MRSA by PCR NEGATIVE NEGATIVE Final    Comment:        The GeneXpert MRSA Assay (FDA approved for NASAL specimens only), is one component of a comprehensive MRSA colonization surveillance program. It is not intended to diagnose MRSA infection nor to guide or monitor treatment for MRSA infections.      Radiology Studies: Dg Chest 2 View  Result Date: 03/17/2016 CLINICAL DATA:  Patient with the flu.  Abdominal distension. EXAM: CHEST  2 VIEW COMPARISON:  None. FINDINGS: The heart size and mediastinal contours are within normal limits. Both lungs are clear. The visualized skeletal structures are unremarkable.  IMPRESSION: No active cardiopulmonary disease. Electronically Signed   By: Annia Beltrew  Davis M.D.   On: 03/17/2016 18:11   Ct Abdomen Pelvis W Contrast  Result Date: 03/17/2016 CLINICAL DATA:  Patient with diarrhea, vomiting. History of implanted. EXAM: CT ABDOMEN AND PELVIS WITH CONTRAST TECHNIQUE: Multidetector CT imaging of the abdomen and pelvis was performed using the standard protocol following bolus administration of intravenous contrast. CONTRAST:  30mL ISOVUE-300 IOPAMIDOL (ISOVUE-300) INJECTION 61%, 100mL ISOVUE-300 IOPAMIDOL (ISOVUE-300) INJECTION 61% COMPARISON:  None. FINDINGS: Lower chest: Normal heart size. No consolidative opacities within the lower lobes bilaterally. No pleural effusion. Hepatobiliary: Liver is small and nodular in contour compatible with cirrhosis. Too small to characterize sub cm low-attenuation lesion within the left hepatic lobe (image 15; series 2). Wall thickening of the gallbladder. No intrahepatic or extrahepatic biliary ductal  dilatation. Pancreas: Unremarkable Spleen: Spleen is enlarged measuring 14 cm. Adrenals/Urinary Tract: The adrenal glands are normal. There is a 1.9 cm cyst within the interpolar region the right kidney. Urinary bladder is unremarkable. Stomach/Bowel: Oral contrast material is demonstrated throughout the small and large bowel without evidence for obstruction. There is suggestion of wall thickening of the small bowel as well as ascending colon, potentially secondary to portal colopathy and/or low albumin. Vascular/Lymphatic: Normal caliber abdominal aorta. No retroperitoneal lymphadenopathy. Reproductive: Prostate is mildly enlarged. Other: There is moderate volume ascites throughout the abdomen. Musculoskeletal: No aggressive or acute appearing osseous lesions. IMPRESSION: Findings most compatible with hepatic cirrhosis and associated splenomegaly. There is moderate to large volume ascites throughout the abdomen. Wall thickening of the small bowel and  ascending colon is likely secondary to portal hypertension and/or low albumin. Gallbladder wall thickening likely secondary to hepatic parenchymal disease. Recommend correlation with LFTs is clinically indicated. Electronically Signed   By: Annia Belt M.D.   On: 03/17/2016 18:21   US Abdomen Limited Ruq  Result Date: 03/18/2016 CLINICAL DATA:  Cirrhosis, ascites, diarrhea, nausea, vomiting and GI bleed. EXAM: US ABDOMEN LIMITED - RIGHT UPPER QUADRANT COMPARISON:  CT of the abdomen on 03/17/2016 FINDINGS: Gallbladder: The gallbladder is relatively contracted and demonstrates diffuse irregular wall thickening. Part of this appearance is likely secondary to adjacent ascites and hypoalbuminemia. Component of cholecystitis cannot be entirely excluded by ultrasound. However, no sonographic Murphy's sign was elicited during the examination. Common bile duct: Diameter: 4 mm Liver: Overtly cirrhotic and echogenic liver with nodular surface contour. The portal vein is open. No evidence of focal hepatic masses or intrahepatic biliary ductal dilatation. Moderate ascites surrounds the liver. IMPRESSION: 1. Contracted and thickened gallbladder. Irregular wall thickening may be secondary to ascites and hypoalbuminemia. No sonographic Murphy's sign was elicited. 2. Cirrhosis of the liver with moderate ascites. Electronically Signed   By: Irish Lack M.D.   On: 03/18/2016 10:50    Scheduled Meds: . feeding supplement  1 Container Oral TID BM  . furosemide  40 mg Intravenous Daily  . pantoprazole (PROTONIX) IV  40 mg Intravenous Q12H  . potassium chloride  40 mEq Oral BID  . sodium chloride flush  3 mL Intravenous Q12H  . spironolactone  25 mg Oral Daily   Continuous Infusions:   LOS: 1 day   CHIU, Scheryl Marten, MD [facility] Pager [phone]  If 7PM-7AM, please contact night-coverage [email or web address] Password [facility] 03/18/2016, 3:43 PM

## 2016-03-18 NOTE — Consult Note (Addendum)
Referring Provider: No ref. provider found Primary Care Physician:  [facility] Primary Gastroenterologist:  Dr. 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  Evaluation by EDP revealed a macrocytic anemia with a hemoglobin of 7.8. Platelet cannula in the 100,000 range. CT demonstrated a small sunken nodular liver with tiny left hepatic lobe lesions and splenomegaly consistent with cirrhosis - moderate ascites.. Some wall thickening of the gallbladder and bowel noted.   Patient gives a history of the "flu" about 2 weeks ago. During this time, he reports taking Pepto-Bismol to settle his stomach. Shortly thereafter, he vomited black material and had a dark stool. He has not had overt hematemesis or hematochezia. ED notes documented Hemoccult-negative stool yesterday. He has been transfused 1 unit of packed red blood cells overnight. Hemoglobin this morning 8.5. Patient denies ever being told he had cirrhosis. However[facility] couple of years ago, per patient report, Hepatitis C came back "positive". He did not return for follow-up because he lost insurance. Risk factors for hepatitis C include multiple tattoos-obtained while he was incarcerated. Distant history of IV drug abuse. Patient denies alcohol use or ongoing illicit drug use other than smoking marijuana daily. Outpatient meds-Alka-Seltzer when necessary Patient denies ever having yellow jaundice. No family history of chronic liver disease/cirrhosis or cancer. He denies any prior GI illnesses or surgeries.   Patient denies chronic symptoms of GERD, dysphagia, odynophagia, early satiety, nausea, vomiting or change in bowel habits otherwise. He is hungry and wants to eat.  Prior to Admission medications   Medication Sig  Start Date End Date Taking? Authorizing Provider  aspirin-sod bicarb-citric acid (ALKA-SELTZER) 325 MG TBEF tablet Take 325 mg by mouth every 6 (six) hours as needed.   Yes Historical Provider, MD    Current Facility-Administered Medications  Medication Dose Route Frequency Provider Last Rate Last Dose  . 0.9 %  sodium chloride infusion   Intravenous Continuous Samuel JesterKathleen McManus, DO 125 mL/hr at 03/17/16 1843    . 0.9 %  sodium chloride infusion   Intravenous STAT Samuel JesterKathleen McManus, DO 125 mL/hr at 03/18/16 0149    . acetaminophen (TYLENOL) tablet 650 mg  650 mg Oral Q6H PRN Levie HeritageJacob J Stinson, DO       Or  . acetaminophen (TYLENOL) suppository 650 mg  650 mg Rectal Q6H PRN Rhona RaiderJacob J Stinson, DO      . feeding supplement (BOOST / RESOURCE BREEZE) liquid 1 Container  1 Container Oral TID BM Levie HeritageJacob J Stinson, DO   1 Container at 03/18/16 1000  . ondansetron (ZOFRAN) tablet 4 mg  4 mg Oral Q6H PRN Rhona RaiderJacob J Stinson, DO       Or  . ondansetron [facility](ZOFRAN) injection 4 mg  4 mg Intravenous Q6H PRN Rhona RaiderJacob J Stinson, DO      . pantoprazole (PROTONIX) injection 40 mg  40 mg Intravenous Q12H Rhona RaiderJacob J Stinson, DO   40 mg at 03/18/16 [id number]  . potassium chloride SA (K-DUR,KLOR-CON) CR tablet 40 mEq  40 mEq Oral BID Jerald KiefStephen K Chiu, MD   40 mEq at 03/18/16 0923  . sodium chloride flush (NS) 0.9 % injection 3 mL  3 mL Intravenous Q12H Rhona RaiderJacob J Stinson, DO   3 mL at 03/18/16 1000    Allergies as of 03/17/2016  . (  No Known Allergies)    No family history on file.  Social History   Social History  . Marital status: Single    Spouse name: N/A  . Number of children: N/A  . Years of education: N/A   Occupational History  . Not on file.   Social History Main Topics  . Smoking status: Former Games developermoker  . Smokeless tobacco: Former NeurosurgeonUser  . Alcohol use No  . Drug use: Unknown  . Sexual activity: Not on file   Other Topics Concern  . Not on file   Social History Narrative  . No narrative on file    Review of  Systems:  As in history of present illness.  Physical Exam: Vital signs in last 24 hours: Temp:  [98.1 F (36.7 C)-100.2 F (37.9 C)] 98.9 F (37.2 C) (11/12 0715) Pulse Rate:  [84-116] 91 (11/12 0800) Resp:  [10-36] 20 (11/12 0800) BP: (81-155)/(50-85) 122/72 (11/12 0800) SpO2:  [95 %-100 %] 100 % (11/12 0800) Weight:  [180 lb (81.6 kg)-185 lb 13.6 oz (84.3 kg)] 185 lb 13.6 oz (84.3 kg) (11/11 2154) Last BM Date: 03/18/16 General:  Very pleasant alert conversant gentleman well oriented and appropriate.  He has multiple tattoos Head:  Normocephalic and atraumatic. Eyes:  Sclera clear, no icterus.   Conjunctiva pale. Lungs:  Clear throughout to auscultation.   No wheezes, crackles, or rhonchi. No acute distress. Heart:  Regular rate and rhythm; no murmurs, clicks, rubs,  or gallops. Abdomen:  Full/mildly distended. Positive bowel sounds soft, nontender. I do not appreciate splenomegaly or organomegaly.   Intake/Output from previous day: 11/11 0701 - 11/12 0700 In: 2396.7 [I.V.:686.7; Blood:710; IV Piggyback:1000] Out: -  Intake/Output this shift: Total I/O In: 730 [P.O.:480; I.V.:250] Out: -   Lab Results:  Recent Labs  03/17/16 1613 03/18/16 0910  WBC 11.4* 14.8*  HGB 7.8* 8.5*  HCT 24.7* 26.0*  PLT 137* 100*   BMET  Recent Labs  03/17/16 1613 03/18/16 0505  NA 129* 128*  K 3.3* 3.3*  CL 93* 96*  CO2 30 27  GLUCOSE 161* 107*  BUN 20 15  CREATININE 0.98 0.82  CALCIUM 7.2* 6.6*   LFT  Recent Labs  03/18/16 0505  PROT 4.1*  ALBUMIN 2.1*  AST 34  ALT 47  ALKPHOS 51  BILITOT 2.6*   PT/INR  Recent Labs  03/17/16 1856  LABPROT 16.0*  INR 1.27   Impression:   Pleasant 40 year old gentleman admitted to the hospital with apparent new onset ascites secondary to underlying cirrhosis. Significantly anemic. Mild thrombocytopenia (MELD 13). Recent history most consistent with a viral syndrome. Dark emesis and stool most likely secondary to ingestion of  bismuth -  although, when this occurred several days ago, coexisting hemorrhage cannot be absolutely excluded. Hemoccult negative this admission. He has been transfused 1 unit of packed red blood cells. Reported history of hepatitis C positivity. Further information available.   Tiny lesions in the left lobe of the liver too small to characterize. Thickening of the bowel and gallbladder wall most likely related to cirrhosis-low oncotic pressure/albumin. Clinically, he does have ascites but it is not tense.  Recommendations:   No need for urgent paracentesis. Will begin a 2 g sodium diet and consider diuretic therapy.     While he is here, I have offered him an EGD to screen for varices and to evaluate recent vomiting history further.       Check hepatitis C antibody and reflex to PCR/genotyping.  We will enroll him in cirrhosis care including periodic follow-up liver imaging.       Would not transfuse further at this time so long as hemoglobin is stable.     Since there is no evidence of acute GI hemorrhage in the setting of cirrhosis, I would not recommend prophylactic antibiotics at this time.     Will advance his diet today for lunch and then we'll back off to clear liquids in preparation for EGD.  I have offered the patient an EGD tomorrow under deep sedation. The risks, benefits, limitations, alternatives and imponderables have been reviewed with the patient. Potential for esophageal band ligation/sclerotherapy biopsy, etc. have also been reviewed.  Questions have been answered. All parties agreeable.  Further recommendations to follow.         Notice:  This dictation was prepared with Dragon dictation along with smaller phrase technology. Any transcriptional errors that result from this process are unintentional and may not be corrected upon review.

## 2016-03-19 ENCOUNTER — Encounter (HOSPITAL_COMMUNITY)
Admission: EM | Disposition: A | Discharge status: Home / Self Care | Payer: Self-pay | Source: Home / Self Care | Attending: Internal Medicine

## 2016-03-19 ENCOUNTER — Inpatient Hospital Stay (HOSPITAL_COMMUNITY): Payer: Self-pay | Admitting: Anesthesiology

## 2016-03-19 ENCOUNTER — Encounter: Payer: Self-pay | Admitting: Nurse Practitioner

## 2016-03-19 ENCOUNTER — Telehealth: Payer: Self-pay | Admitting: Gastroenterology

## 2016-03-19 ENCOUNTER — Encounter (HOSPITAL_COMMUNITY): Payer: Self-pay | Admitting: *Deleted

## 2016-03-19 DIAGNOSIS — K3189 Other diseases of stomach and duodenum: Secondary | ICD-10-CM

## 2016-03-19 DIAGNOSIS — Z13810 Encounter for screening for upper gastrointestinal disorder: Secondary | ICD-10-CM

## 2016-03-19 DIAGNOSIS — I8500 Esophageal varices without bleeding: Secondary | ICD-10-CM

## 2016-03-19 HISTORY — PX: ESOPHAGOGASTRODUODENOSCOPY (EGD) WITH PROPOFOL: SHX5813

## 2016-03-19 LAB — CBC
HCT: 24.1 % — ABNORMAL LOW (ref 39.0–52.0)
Hemoglobin: 7.7 g/dL — ABNORMAL LOW (ref 13.0–17.0)
MCH: 32.1 pg (ref 26.0–34.0)
MCHC: 32 g/dL (ref 30.0–36.0)
MCV: 100.4 fL — AB (ref 78.0–100.0)
PLATELETS: 77 10*3/uL — AB (ref 150–400)
RBC: 2.4 MIL/uL — AB (ref 4.22–5.81)
RDW: 21.6 % — AB (ref 11.5–15.5)
WBC: 6.1 10*3/uL (ref 4.0–10.5)

## 2016-03-19 LAB — HEPATITIS PANEL, ACUTE
HEP A IGM: NEGATIVE
HEP B C IGM: NEGATIVE
HEP B S AG: NEGATIVE

## 2016-03-19 LAB — COMPREHENSIVE METABOLIC PANEL
ALBUMIN: 2.1 g/dL — AB (ref 3.5–5.0)
ALK PHOS: 51 U/L (ref 38–126)
ALT: 42 U/L (ref 17–63)
AST: 32 U/L (ref 15–41)
Anion gap: 4 — ABNORMAL LOW (ref 5–15)
BUN: 14 mg/dL (ref 6–20)
CALCIUM: 6.8 mg/dL — AB (ref 8.9–10.3)
CHLORIDE: 99 mmol/L — AB (ref 101–111)
CO2: 28 mmol/L (ref 22–32)
CREATININE: 0.83 mg/dL (ref 0.61–1.24)
GFR calc Af Amer: >60 mL/min (ref >60)
GFR calc non Af Amer: >60 mL/min (ref >60)
GLUCOSE: 99 mg/dL (ref 65–99)
Potassium: 4 mmol/L (ref 3.5–5.1)
SODIUM: 131 mmol/L — AB (ref 135–145)
Total Bilirubin: 1.5 mg/dL — ABNORMAL HIGH (ref 0.3–1.2)
Total Protein: 4.3 g/dL — ABNORMAL LOW (ref 6.5–8.1)

## 2016-03-19 LAB — HEMOGLOBIN A1C
Hgb A1c MFr Bld: <4.2 % — ABNORMAL LOW (ref 4.8–5.6)
Mean Plasma Glucose: <74 mg/dL

## 2016-03-19 LAB — HEPATITIS C ANTIBODY (REFLEX)

## 2016-03-19 LAB — COMMENT2 - HEP PANEL

## 2016-03-19 LAB — OCCULT BLOOD, POC DEVICE: Fecal Occult Bld: NEGATIVE

## 2016-03-19 SURGERY — ESOPHAGOGASTRODUODENOSCOPY (EGD) WITH PROPOFOL
Anesthesia: Monitor Anesthesia Care

## 2016-03-19 MED ORDER — LACTATED RINGERS IV SOLN
INTRAVENOUS | Status: DC | PRN
  Administered 2016-03-19: 11:00:00 via INTRAVENOUS

## 2016-03-19 MED ORDER — MIDAZOLAM HCL 2 MG/2ML IJ SOLN
INTRAMUSCULAR | Status: AC
  Filled 2016-03-19: qty 2

## 2016-03-19 MED ORDER — PANTOPRAZOLE SODIUM 40 MG PO TBEC
40.0000 mg | DELAYED_RELEASE_TABLET | Freq: Every day | ORAL | Status: DC
  Administered 2016-03-19: 40 mg via ORAL
  Filled 2016-03-19 (×2): qty 1

## 2016-03-19 MED ORDER — FENTANYL CITRATE (PF) 100 MCG/2ML IJ SOLN
INTRAMUSCULAR | Status: AC
  Filled 2016-03-19: qty 2

## 2016-03-19 MED ORDER — PROPOFOL 500 MG/50ML IV EMUL
INTRAVENOUS | Status: DC | PRN
Start: 2016-03-19 — End: 2016-03-19
  Administered 2016-03-19: 150 ug/kg/min via INTRAVENOUS

## 2016-03-19 MED ORDER — SODIUM CHLORIDE 0.9 % IV SOLN: INTRAVENOUS | Status: DC

## 2016-03-19 MED ORDER — LACTATED RINGERS IV SOLN
INTRAVENOUS | Status: DC
  Administered 2016-03-19: 10:00:00 via INTRAVENOUS

## 2016-03-19 MED ORDER — FENTANYL CITRATE (PF) 100 MCG/2ML IJ SOLN
25.0000 ug | INTRAMUSCULAR | Status: DC | PRN
  Administered 2016-03-19: 25 ug via INTRAVENOUS

## 2016-03-19 MED ORDER — MIDAZOLAM HCL 2 MG/2ML IJ SOLN
1.0000 mg | INTRAMUSCULAR | Status: DC | PRN
  Administered 2016-03-19: 2 mg via INTRAVENOUS

## 2016-03-19 MED ORDER — PANTOPRAZOLE SODIUM 40 MG PO TBEC: 40.0000 mg | DELAYED_RELEASE_TABLET | Freq: Every day | ORAL | 0 refills | Status: DC

## 2016-03-19 MED ORDER — SPIRONOLACTONE 25 MG PO TABS: 25.0000 mg | ORAL_TABLET | Freq: Two times a day (BID) | ORAL | 0 refills | Status: DC

## 2016-03-19 MED ORDER — AMOXICILLIN-POT CLAVULANATE 875-125 MG PO TABS: 1.0000 | ORAL_TABLET | Freq: Two times a day (BID) | ORAL | 0 refills | Status: DC

## 2016-03-19 MED ORDER — FUROSEMIDE 20 MG PO TABS: 20.0000 mg | ORAL_TABLET | Freq: Every day | ORAL | 0 refills | Status: DC

## 2016-03-19 MED ORDER — PROPOFOL 10 MG/ML IV BOLUS
INTRAVENOUS | Status: AC
  Filled 2016-03-19: qty 20

## 2016-03-19 NOTE — Telephone Encounter (Signed)
Please make an appt for hospital follow-up in 2 weeks. Patient was seen by Benjamin Miller while inpatient. If he doesn't have anything, may put with me.

## 2016-03-19 NOTE — Telephone Encounter (Signed)
APPT MADE AND LETTER SENT  °

## 2016-03-19 NOTE — Anesthesia Postprocedure Evaluation (Signed)
Anesthesia Post Note  Patient: Benjamin Miller  Procedure(s) Performed: Procedure(s) (LRB): ESOPHAGOGASTRODUODENOSCOPY (EGD) WITH PROPOFOL (N/A)  Patient location during evaluation: PACU Anesthesia Type: MAC Level of consciousness: awake, awake and alert, oriented and patient cooperative Pain management: pain level controlled Vital Signs Assessment: post-procedure vital signs reviewed and stable Respiratory status: spontaneous breathing, nonlabored ventilation and respiratory function stable Cardiovascular status: stable Postop Assessment: no headache, no backache and no signs of nausea or vomiting Anesthetic complications: no    Last Vitals:  Vitals:   03/19/16 0623 03/19/16 1001  BP: 110/67 (!) 117/58  Pulse: 80 79  Resp:  18  Temp:  36.7 C    Last Pain:  Vitals:   03/19/16 1001  TempSrc:   PainSc: 0-No pain                 Nabil Bubolz L

## 2016-03-19 NOTE — Op Note (Signed)
[facility] Patient Name: Benjamin Miller Procedure Date: 03/19/2016 10:48 AM MRN: [id number] Date of Birth: [birth year] Attending MD: Gennette Pac , MD CSN: [id number] Age: 40 Admit Type: Outpatient Procedure:                Upper GI endoscopy with EBL Indications:              Screening procedure; Possible melena and                            significant anemia requiring transfusion. Providers:                Gennette Pac, MD, Jannett Celestine, RN, Birder Robson, Technician Referring MD:             Shirlean Mylar Medicines:                Propofol per Anesthesia Complications:            No immediate complications. Estimated Blood Loss:     Estimated blood loss: none. Procedure:                Pre-Anesthesia Assessment:                           - Prior to the procedure, a History and Physical                            was performed, and patient medications and                            allergies were reviewed. The patient's tolerance of                            previous anesthesia was also reviewed. The risks                            and benefits of the procedure and the sedation                            options and risks were discussed with the patient.                            All questions were answered, and informed consent                            was obtained. Prior Anticoagulants: The patient has                            taken no previous anticoagulant or antiplatelet                            agents. ASA Grade Assessment: III - A patient with  severe systemic disease. After reviewing the risks                            and benefits, the patient was deemed in                            satisfactory condition to undergo the procedure.                           After obtaining informed consent, the endoscope was                            passed under direct vision. Throughout the              procedure, the patient's blood pressure, pulse, and                            oxygen saturations were monitored continuously. The                            [id number] ([id number]) scope was introduced through the                            and advanced to the second part of duodenum. The                            upper GI endoscopy was accomplished without                            difficulty. The patient tolerated the procedure                            well. Scope In: 10:56:26 AM Scope Out: 11:08:36 AM Total Procedure Duration: 0 hours 12 minutes 10 seconds  Findings:      Grade III varices were found in the esophagus. multiple with a       "geographic distribution distally. " Protuberant cherry-red spots" on 2       of the varices distally. Seven bands were successfully placed on       7variceal vessels. Good hemostasis maintained..      Portal hypertensive gastropathy was found in the entire examined stomach.      The duodenal bulb and second portion of the duodenum were normal. Impression:               - Grade III esophageal varices with bleeding                            stigmata. Banded.                           - Portal hypertensive gastropathy.                           - Normal duodenal bulb and second portion of the  duodenum.                           - No specimens collected. Moderate Sedation:      Moderate (conscious) sedation was personally administered by an       anesthesia professional. The following parameters were monitored: oxygen       saturation, heart rate, blood pressure, respiratory rate, EKG, adequacy       of pulmonary ventilation, and response to care. Total physician       intraservice time was 15 minutes. Recommendation:           - Return patient to hospital ward for ongoing care.                           - Full liquid diet (2 gram sodium). Consider                            increasing Aldactone to 25 mg twice  daily. Consider                            discharging on Lasix 20 mg daily. Dietary should                            come and see both patient and wife regarding a 2 g                            sodium diet at home. no need for paracentesis at                            this time.                           - decrease Protonix to 40 mg once daily                           - Repeat upper endoscopy in 4 weeks for                            surveillance.                           - Return to GI office in 2 weeks continue                            established cirrhosis care. Follow-up on pending                            blood work here ([facility]). I have discussed my                            findings and recommendations with the patient's                            wife, Benjamin Miller) at [phone]?"[phone]. Procedure Code(s):        ---  Professional ---                           [phone], Esophagogastroduodenoscopy, flexible,                            transoral; with band ligation of esophageal/gastric                            varices Diagnosis Code(s):        --- Professional ---                           I85.00, Esophageal varices without bleeding                           K76.6, Portal hypertension                           K31.89, Other diseases of stomach and duodenum                           Z13.810, Encounter for screening for upper                            gastrointestinal disorder CPT copyright 2016 [employer] Association. All rights reserved. The codes documented in this report are preliminary and upon coder review may  be revised to meet current compliance requirements. [name]. [name], MD Gennette Pac, MD 03/19/2016 11:31:07 AM This report has been signed electronically. Number of Addenda: 0

## 2016-03-19 NOTE — Care Management Note (Signed)
Case Management Note  Patient Details  Name: Benjamin Miller MRN: [id number] Date of Birth: 1975/09/19  Expected Discharge Date:       03/19/2016           Expected Discharge Plan:  Home/Self Care  In-House Referral:  Financial Counselor  Discharge planning Services  CM Consult  Post Acute Care Choice:  NA Choice offered to:  NA  DME Arranged:    DME Agency:     HH Arranged:    HH Agency:     Status of Service:  Completed, signed off  If discussed at [employer] Length of Stay Meetings, dates discussed:    Additional Comments:Patient will DC today. No CM needs. Gave list of providers accepting patients. Financial counselor notified of no insurance and following.  Sadiel Mota, Chrystine OilerSharley Diane, RN 03/19/2016, 3:22 PM

## 2016-03-19 NOTE — Transfer of Care (Signed)
Immediate Anesthesia Transfer of Care Note  Patient: Benjamin MareDavid Miller  Procedure(s) Performed: Procedure(s) with comments: ESOPHAGOGASTRODUODENOSCOPY (EGD) WITH PROPOFOL (N/A) - with propofol  Patient Location: PACU  Anesthesia Type:MAC  Level of Consciousness: awake, alert , oriented and patient cooperative  Airway & Oxygen Therapy: Patient Spontanous Breathing  Post-op Assessment: Report given to RN and Post -op Vital signs reviewed and stable  Post vital signs: Reviewed and stable  Last Vitals:  Vitals:   03/19/16 0623 03/19/16 1001  BP: 110/67 (!) 117/58  Pulse: 80 79  Resp:  18  Temp:  36.7 C    Last Pain:  Vitals:   03/19/16 1001  TempSrc:   PainSc: 0-No pain      Patients Stated Pain Goal: 1 (03/19/16 1001)  Complications: No apparent anesthesia complications

## 2016-03-19 NOTE — Addendum Note (Signed)
Addendum  created 03/19/16 1132 by Yolonda KidaAlison L Meng [place], CRNA   Charge Capture section accepted

## 2016-03-19 NOTE — Discharge Summary (Addendum)
Physician Discharge Summary  Benjamin Miller ZOX:[id number] DOB: June 17, 1975 DOA: 03/17/2016  [facility]  Admit date: 03/17/2016 Discharge date: 03/19/2016  Admitted From: Home Disposition:  Home  Recommendations for Outpatient Follow-up:  1. Follow up with PCP in 1-2 weeks 2. Recommend repeat CBC in 1-2 weeks 3. Follow up with Dr. [name] in 2 weeks 4. Patient states he will be following up with dentist   Discharge Condition:Stable CODE STATUS:Full Diet recommendation: Low sodium   Brief/Interim Summary: 40 y.o.malewith a possiblehistory of hepatitis C who's been having abdominal distention for 10 days with nausea and vomiting that started a week ago. The patient took Pepto-Bismol intimately threw it up and had diarrhea. He had diarrhea for 2-3 days that was black in color. The patient took Imodium which resolved his diarrhea and the dark stools. He continues to have abdominal distention and came to the hospital with his wife. No other palliating or provoking factors. He denies nausea, vomiting, diarrhea currently. No fevers or chills. Denies alcohol use. He does not take any medications. He does relate that at one point he was diagnosed with hepatitis C, although as this was never confirmed and he has never been treated   #1 upper GI bleed  Protonix was ordered, to be continued once daily  GI was consulted. Recommendations for 2 g sodium pressure to diet.  Patient underwent EGD on 03/19/2016 with findings of grade 3 esophageal varices with bleeding stigmata. These were banded. Is also findings of portal hypertensive gastropathy. The duodenal bulb and second portion of the duodenum appeared normal.  Gastroenterology recommendations for Protonix 40 mg by mouth daily, repeat upper endoscopy in 4 weeks, return to GI office in 2 weeks  Also recommendations for Lasix 20 mg by mouth daily, spironolactone 25 mg by mouth twice a day #2 symptomatic  anemia  Patient was given 1 unit this hospital admission  Hemoglobin on day of discharge noted be 7.7. Discuss with gastroenterology, recommendations to hold off on transfusion at this time #3 cirrhosis of liver with ascites  CT and abdominal ultrasound reviewed  Imaging findings suggestive of cirrhosis with moderate ascites  Per above, no need for urgent paracentesis at this time per GI  Hepatitis C serologies pending #4 hyponatremia  Likely secondary to volume overload from cirrhosis  Improved with diuretics #5 portal hypertension             Per above, continue diuretic therapy #6 poor dentition  Patient reports poor dentition, with tooth pain.  Patient reports being scheduled to follow-up with his dentist in the very near future.  Presently, patient afebrile and with no leukocytosis  Will empirically cover with Augmentin until patient can be seen by his dentist as scheduled  Discharge Diagnoses:  Principal Problem:   Acute upper GI bleeding Active Problems:   Cirrhosis of liver with ascites (HCC)   Symptomatic anemia   Hyponatremia   Portal hypertension (HCC)    Discharge Instructions     Medication List    STOP taking these medications   aspirin-sod bicarb-citric acid 325 MG Tbef tablet Commonly known as:  ALKA-SELTZER     TAKE these medications   amoxicillin-clavulanate 875-125 MG tablet Commonly known as:  AUGMENTIN Take 1 tablet by mouth 2 (two) times daily.   furosemide 20 MG tablet Commonly known as:  LASIX Take 1 tablet (20 mg total) by mouth daily.   pantoprazole 40 MG tablet Commonly known as:  PROTONIX Take 1 tablet (40 mg total)  by mouth daily. Start taking on:  03/20/2016   spironolactone 25 MG tablet Commonly known as:  ALDACTONE Take 1 tablet (25 mg total) by mouth 2 (two) times daily.      Follow-up Information    [facility]. Schedule an appointment as soon as possible for a visit in 1 week(s).    Specialty:  Family Medicine Contact information: [address] [place] [address] [phone]        Eula Listenobert Rourk, MD. Schedule an appointment as soon as possible for a visit in 2 week(s).   Specialty:  Gastroenterology Contact information: [address] [place] [address] [phone]        Follow up with your dentist at next available appointment Follow up.          No Known Allergies  Consultations:  Gastroenterology  Procedures/Studies: Dg Chest 2 View  Result Date: 03/17/2016 CLINICAL DATA:  Patient with the flu.  Abdominal distension. EXAM: CHEST  2 VIEW COMPARISON:  None. FINDINGS: The heart size and mediastinal contours are within normal limits. Both lungs are clear. The visualized skeletal structures are unremarkable. IMPRESSION: No active cardiopulmonary disease. Electronically Signed   By: Annia Beltrew  Davis M.D.   On: 03/17/2016 18:11   Ct Abdomen Pelvis W Contrast  Result Date: 03/17/2016 CLINICAL DATA:  Patient with diarrhea, vomiting. History of implanted. EXAM: CT ABDOMEN AND PELVIS WITH CONTRAST TECHNIQUE: Multidetector CT imaging of the abdomen and pelvis was performed using the standard protocol following bolus administration of intravenous contrast. CONTRAST:  30mL ISOVUE-300 IOPAMIDOL (ISOVUE-300) INJECTION 61%, 100mL ISOVUE-300 IOPAMIDOL (ISOVUE-300) INJECTION 61% COMPARISON:  None. FINDINGS: Lower chest: Normal heart size. No consolidative opacities within the lower lobes bilaterally. No pleural effusion. Hepatobiliary: Liver is small and nodular in contour compatible with cirrhosis. Too small to characterize sub cm low-attenuation lesion within the left hepatic lobe (image 15; series 2). Wall thickening of the gallbladder. No intrahepatic or extrahepatic biliary ductal dilatation. Pancreas: Unremarkable Spleen: Spleen is enlarged measuring 14 cm. Adrenals/Urinary Tract: The adrenal glands are normal. There is a 1.9 cm cyst within the interpolar  region the right kidney. Urinary bladder is unremarkable. Stomach/Bowel: Oral contrast material is demonstrated throughout the small and large bowel without evidence for obstruction. There is suggestion of wall thickening of the small bowel as well as ascending colon, potentially secondary to portal colopathy and/or low albumin. Vascular/Lymphatic: Normal caliber abdominal aorta. No retroperitoneal lymphadenopathy. Reproductive: Prostate is mildly enlarged. Other: There is moderate volume ascites throughout the abdomen. Musculoskeletal: No aggressive or acute appearing osseous lesions. IMPRESSION: Findings most compatible with hepatic cirrhosis and associated splenomegaly. There is moderate to large volume ascites throughout the abdomen. Wall thickening of the small bowel and ascending colon is likely secondary to portal hypertension and/or low albumin. Gallbladder wall thickening likely secondary to hepatic parenchymal disease. Recommend correlation with LFTs is clinically indicated. Electronically Signed   By: Annia Beltrew  Davis M.D.   On: 03/17/2016 18:21   [place] Abdomen Limited Ruq  Result Date: 03/18/2016 CLINICAL DATA:  Cirrhosis, ascites, diarrhea, nausea, vomiting and GI bleed. EXAM: US ABDOMEN LIMITED - RIGHT UPPER QUADRANT COMPARISON:  CT of the abdomen on 03/17/2016 FINDINGS: Gallbladder: The gallbladder is relatively contracted and demonstrates diffuse irregular wall thickening. Part of this appearance is likely secondary to adjacent ascites and hypoalbuminemia. Component of cholecystitis cannot be entirely excluded by ultrasound. However, no sonographic Murphy's sign was elicited during the examination. Common bile duct: Diameter: 4 mm Liver: Overtly  cirrhotic and echogenic liver with nodular surface contour. The portal vein is open. No evidence of focal hepatic masses or intrahepatic biliary ductal dilatation. Moderate ascites surrounds the liver. IMPRESSION: 1. Contracted and thickened gallbladder.  Irregular wall thickening may be secondary to ascites and hypoalbuminemia. No sonographic Murphy's sign was elicited. 2. Cirrhosis of the liver with moderate ascites. Electronically Signed   By: Irish Lack M.D.   On: 03/18/2016 10:50    EGD 03/19/2016 with banding of grade 3 esophageal varices  Subjective: Eager to go home today  Discharge Exam: Vitals:   03/19/16 1240 03/19/16 1354  BP: 102/64 136/66  Pulse: 76 75  Resp: 16 18  Temp:  97.8 F (36.6 C)   Vitals:   03/19/16 1122 03/19/16 1130 03/19/16 1240 03/19/16 1354  BP: 133/65 112/67 102/64 136/66  Pulse: 77 72 76 75  Resp: 16 15 16 18   Temp: 98.5 F (36.9 C)   97.8 F (36.6 C)  TempSrc:    Oral  SpO2: 100% 99% 100% 99%  Weight:      Height:        General: Pt is alert, awake, not in acute distress Cardiovascular: RRR, S1/S2 +, no rubs, no gallops Respiratory: CTA bilaterally, no wheezing, no rhonchi Abdominal: Soft, NT, ND, bowel sounds + Extremities: no edema, no cyanosis   The results of significant diagnostics from this hospitalization (including imaging, microbiology, ancillary and laboratory) are listed below for reference.     Microbiology: Recent Results (from the past 240 hour(s))  MRSA PCR Screening     Status: None   Collection Time: 03/17/16 10:20 PM  Result Value Ref Range Status   MRSA by PCR NEGATIVE NEGATIVE Final    Comment:        The GeneXpert MRSA Assay (FDA approved for NASAL specimens only), is one component of a comprehensive MRSA colonization surveillance program. It is not intended to diagnose MRSA infection nor to guide or monitor treatment for MRSA infections.      Labs: BNP (last 3 results) No results for input(s): BNP in the last 8760 hours. Basic Metabolic Panel:  Recent Labs Lab 03/17/16 1613 03/18/16 0505 03/19/16 0712  NA 129* 128* 131*  K 3.3* 3.3* 4.0  CL 93* 96* 99*  CO2 30 27 28   GLUCOSE 161* 107* 99  BUN 20 15 14   CREATININE 0.98 0.82 0.83   CALCIUM 7.2* 6.6* 6.8*   Liver Function Tests:  Recent Labs Lab 03/17/16 1613 03/18/16 0505 03/19/16 0712  AST 49* 34 32  ALT 66* 47 42  ALKPHOS 71 51 51  BILITOT 1.8* 2.6* 1.5*  PROT 5.4* 4.1* 4.3*  ALBUMIN 2.6* 2.1* 2.1*    Recent Labs Lab 03/17/16 1613  LIPASE 162*    Recent Labs Lab 03/17/16 1856  AMMONIA 17   CBC:  Recent Labs Lab 03/17/16 1613 03/18/16 0910 03/19/16 0712  WBC 11.4* 14.8* 6.1  NEUTROABS 8.1* 11.7*  --   HGB 7.8* 8.5* 7.7*  HCT 24.7* 26.0* 24.1*  MCV 106.0* 100.0 100.4*  PLT 137* 100* 77*   Cardiac Enzymes: No results for input(s): CKTOTAL, CKMB, CKMBINDEX, TROPONINI in the last 168 hours. BNP: Invalid input(s): POCBNP CBG: No results for input(s): GLUCAP in the last 168 hours. D-Dimer No results for input(s): DDIMER in the last 72 hours. Hgb A1c  Recent Labs  03/17/16 1856  HGBA1C <4.2*   Lipid Profile No results for input(s): CHOL, HDL, LDLCALC, TRIG, CHOLHDL, LDLDIRECT in the last 72 hours. Thyroid  function studies No results for input(s): TSH, T4TOTAL, T3FREE, THYROIDAB in the last 72 hours.  Invalid input(s): FREET3 Anemia work up No results for input(s): VITAMINB12, FOLATE, FERRITIN, TIBC, IRON, RETICCTPCT in the last 72 hours. Urinalysis    Component Value Date/Time   COLORURINE YELLOW 03/17/2016 1517   APPEARANCEUR HAZY (A) 03/17/2016 1517   LABSPEC 1.020 03/17/2016 1517   PHURINE 5.5 03/17/2016 1517   GLUCOSEU NEGATIVE 03/17/2016 1517   HGBUR NEGATIVE 03/17/2016 1517   BILIRUBINUR NEGATIVE 03/17/2016 1517   KETONESUR NEGATIVE 03/17/2016 1517   PROTEINUR NEGATIVE 03/17/2016 1517   NITRITE NEGATIVE 03/17/2016 1517   LEUKOCYTESUR NEGATIVE 03/17/2016 1517   Sepsis Labs Invalid input(s): PROCALCITONIN,  WBC,  LACTICIDVEN Microbiology Recent Results (from the past 240 hour(s))  MRSA PCR Screening     Status: None   Collection Time: 03/17/16 10:20 PM  Result Value Ref Range Status   MRSA by PCR NEGATIVE  NEGATIVE Final    Comment:        The GeneXpert MRSA Assay (FDA approved for NASAL specimens only), is one component of a comprehensive MRSA colonization surveillance program. It is not intended to diagnose MRSA infection nor to guide or monitor treatment for MRSA infections.      SIGNED:   Jerald Kief, MD  [facility] 03/19/2016, 3:12 PM  If 7PM-7AM, please contact night-coverage [email or web address] Password [email or web address]

## 2016-03-19 NOTE — Clinical Social Work Note (Signed)
CSW received consult for no PCP and no insurance. Will notify CM and financial counselor. CSW will sign off.   Derenda FennelKara Jenisis Harmsen, LCSW [phone]

## 2016-03-19 NOTE — Anesthesia Preprocedure Evaluation (Signed)
Anesthesia Evaluation  Patient identified by MRN, date of birth, ID band Patient awake    Reviewed: Allergy & Precautions, NPO status , Patient's Chart, lab work & pertinent test results  Airway Mallampati: I  TM Distance: >3 FB     Dental  (+) Poor Dentition, Chipped, Loose,    Pulmonary former smoker,    breath sounds clear to auscultation       Cardiovascular negative cardio ROS   Rhythm:Regular Rate:Normal     Neuro/Psych    GI/Hepatic (+) Cirrhosis  (portal Htn, thrombocytopenia)      , UGI bleed    Endo/Other    Renal/GU      Musculoskeletal   Abdominal   Peds  Hematology   Anesthesia Other Findings   Reproductive/Obstetrics                             Anesthesia Physical Anesthesia Plan  ASA: III  Anesthesia Plan: MAC   Post-op Pain Management:    Induction: Intravenous  Airway Management Planned: Simple Face Mask  Additional Equipment:   Intra-op Plan:   Post-operative Plan:   Informed Consent: I have reviewed the patients History and Physical, chart, labs and discussed the procedure including the risks, benefits and alternatives for the proposed anesthesia with the patient or authorized representative who has indicated his/her understanding and acceptance.     Plan Discussed with:   Anesthesia Plan Comments:         Anesthesia Quick Evaluation

## 2016-03-19 NOTE — Progress Notes (Signed)
IV removed, WNL. Pt is currently taking a shower and then D/C instructions will be provided to the patient.

## 2016-03-21 LAB — TYPE AND SCREEN
ABO/RH(D): B POS
ANTIBODY SCREEN: NEGATIVE
UNIT DIVISION: 0
Unit division: 0

## 2016-03-22 ENCOUNTER — Encounter (HOSPITAL_COMMUNITY): Payer: Self-pay | Admitting: Internal Medicine

## 2016-04-04 ENCOUNTER — Other Ambulatory Visit (HOSPITAL_COMMUNITY)
Admission: RE | Admit: 2016-04-04 | Discharge: 2016-04-04 | Disposition: A | Discharge status: Home / Self Care | Payer: Self-pay | Source: Ambulatory Visit | Attending: Nurse Practitioner | Admitting: Nurse Practitioner

## 2016-04-04 ENCOUNTER — Encounter: Payer: Self-pay | Admitting: Nurse Practitioner

## 2016-04-04 ENCOUNTER — Ambulatory Visit (INDEPENDENT_AMBULATORY_CARE_PROVIDER_SITE_OTHER): Payer: Self-pay | Admitting: Nurse Practitioner

## 2016-04-04 VITALS — BP 129/82 | HR 88 | Temp 97.7°F | Ht 72.0 in | Wt 188.4 lb

## 2016-04-04 DIAGNOSIS — R188 Other ascites: Principal | ICD-10-CM

## 2016-04-04 DIAGNOSIS — I8511 Secondary esophageal varices with bleeding: Secondary | ICD-10-CM

## 2016-04-04 DIAGNOSIS — K7460 Unspecified cirrhosis of liver: Secondary | ICD-10-CM | POA: Insufficient documentation

## 2016-04-04 DIAGNOSIS — I8500 Esophageal varices without bleeding: Secondary | ICD-10-CM | POA: Insufficient documentation

## 2016-04-04 LAB — PROTIME-INR
INR: 1.15
Prothrombin Time: 14.8 seconds (ref 11.4–15.2)

## 2016-04-04 LAB — HEPATIC FUNCTION PANEL
ALBUMIN: 3.2 g/dL — AB (ref 3.5–5.0)
ALK PHOS: 66 U/L (ref 38–126)
ALT: 41 U/L (ref 17–63)
AST: 42 U/L — AB (ref 15–41)
BILIRUBIN DIRECT: 0.2 mg/dL (ref 0.1–0.5)
BILIRUBIN TOTAL: 0.7 mg/dL (ref 0.3–1.2)
Indirect Bilirubin: 0.5 mg/dL (ref 0.3–0.9)
Total Protein: 6.6 g/dL (ref 6.5–8.1)

## 2016-04-04 LAB — LIPASE, BLOOD: Lipase: 59 U/L — ABNORMAL HIGH (ref 11–51)

## 2016-04-04 MED ORDER — NADOLOL 20 MG PO TABS: 20.0000 mg | ORAL_TABLET | Freq: Every day | ORAL | 1 refills | Status: DC

## 2016-04-04 NOTE — Progress Notes (Signed)
Referring Provider: Trey SailorsPa, [facility] Primary Care Physician:  Frederich ChickWEBB, CAROL D, MD Primary GI:  Dr.   Chief Complaint  Patient presents with  . other    swelling in abdomen    HPI:   Benjamin Miller is a 40 y.o. male who presents for post-hospital follow-up. The patient was admitted to the hospital on 03/17/2016 and discharged on 03/19/2016 for upper GI bleed, symptomatic anemia, liver cirrhosis with ascites. He presented to the hospital with new onset ascites secondary to underlying cirrhosis, significantly anemic and mild thrombocytopenia. His meld score was 13. Noted dark emesis and stool likely due to taking Pepto-Bismol although coexisting hemorrhage cannot be excluded. He was Hemoccult negative and transfused one unit of packed red blood cells. Reported history of positive hepatitis C. Abdominal imaging with tiny lesions in the left lobe of the liver too small to characterize, thickening of the bowel and gallbladder wall most likely related to cirrhosis and low oncotic pressure/albumin. Team is no need for urgent paracentesis, was started on 2 g sodium diet and diuretics. An upper endoscopy was offered and performed on 03/19/2016 which found grade 3 esophageal varices with bleeding stigmata status post banding, portal hypertensive gastropathy, normal duodenum.  Recommended 2 g sodium diet, consider increasing diuretics, dietary consult regarding 2 g sodium diet, repeat upper endoscopy in 4 weeks for surveillance in 2 week follow-up to establish cirrhosis care.  His hepatitis C antibody came back positive, hepatitis B surface antigen negative, hepatitis A IgM negative, hepatitis B core IgM negative.   Today he states he has been doing well. Has been avoiding heavy lifting. Denies overt abdominal pain, N/V. Occasional swelling which is typically well-managed with diuretics. Also with LE ankle edema. He states the first week out of the hospital he had significant swelling which improved  with diuretics, now having no significant abdominal swelling other than trace amounts. If he exerts himself significantly will have some dyspnea. Denies hematochezia, melena, N/V, dysphagia. Energy has been good. Denies chest pain, dyspnea, dizziness, lightheadedness, syncope, near syncope. Denies any other upper or lower GI symptoms.  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  Past Medical History:  Diagnosis Date  . Chronic hepatitis C with cirrhosis (HCC)   . Hepatitis C     Past Surgical History:  Procedure Laterality Date  . ESOPHAGOGASTRODUODENOSCOPY (EGD) WITH PROPOFOL N/A 03/19/2016   Procedure: ESOPHAGOGASTRODUODENOSCOPY (EGD) WITH PROPOFOL;  Surgeon: Corbin Adeobert M Rourk, MD;  Location: [facility];  Service: Endoscopy;  Laterality: N/A;  with propofol    Current Outpatient Prescriptions  Medication Sig Dispense Refill  . furosemide (LASIX) 20 MG tablet Take 1 tablet (20 mg total) by mouth daily. 30 tablet 0  . pantoprazole (PROTONIX) 40 MG tablet Take 1 tablet (40 mg total) by mouth daily. 30 tablet 0  . spironolactone (ALDACTONE) 25 MG tablet Take 1 tablet (25 mg total) by mouth 2 (two) times daily. 60 tablet 0   No current facility-administered medications for this visit.     Allergies as of 04/04/2016  . (No Known Allergies)    Family History  Problem Relation Age of Onset  . Colon cancer Neg Hx   . Liver cancer Neg Hx   . Liver disease Neg Hx     Social History   Social History  . Marital status: Single    Spouse name: N/A  . Number of children: N/A  . Years of education: N/A   Social History Main  Topics  . Smoking status: Former Smoker    Types: Cigarettes    Start date: 04/05/1987    Quit date: 02/03/2016  . Smokeless tobacco: Never Used  . Alcohol use No  . Drug use:     Frequency: 7.0 times per week    Types: Marijuana  . Sexual activity: Not Asked   Other Topics Concern  . None   Social History Narrative  . None    Review of  Systems: General: Negative for anorexia, weight loss, fever, chills, fatigue, weakness. ENT: Negative for hoarseness, difficulty swallowing. CV: Negative for chest pain, angina, palpitations, peripheral edema.  Respiratory: Negative for dyspnea at rest, cough, sputum, wheezing.  GI: See history of present illness. Derm: Negative for rash or itching.  Neuro: Negative for weakness, abnormal sensation, seizure, frequent headaches, memory loss, confusion.  Psych: Negative for anxiety, depression, suicidal ideation, hallucinations.  Endo: Negative for unusual weight change.  Heme: Negative for bruising or bleeding.   Physical Exam: BP 129/82   Pulse 88   Temp 97.7 F (36.5 C) (Oral)   Ht 6' (1.829 m)   Wt 188 lb 6.4 oz (85.5 kg)   BMI 25.55 kg/m  General:   Alert and oriented. Pleasant and cooperative. Well-nourished and well-developed. Appears older than stated age. Eyes:  Without icterus, sclera clear and conjunctiva pink.  Ears:  Normal auditory acuity. Cardiovascular:  S1, S2 present without murmurs appreciated. Extremities with 2+ bilateral pitting edema (R>L). Respiratory:  Clear to auscultation bilaterally. No wheezes, rales, or rhonchi. No distress.  Gastrointestinal:  +BS, rounded but soft, non-tender and mildly distended. No HSM noted. No guarding or rebound. No masses appreciated.  Rectal:  Deferred  Musculoskalatal:  Symmetrical without gross deformities. Skin:  Intact without significant lesions or rashes. Neurologic:  Alert and oriented x4;  grossly normal neurologically. Psych:  Alert and cooperative. Normal mood and affect. Heme/Lymph/Immune: No excessive bruising noted.    04/04/2016 9:56 AM   Disclaimer: This note was dictated with voice recognition software. Similar sounding words can inadvertently be transcribed and may not be corrected upon review.  

## 2016-04-04 NOTE — Patient Instructions (Signed)
1. Have your labs drawn when you're able to. Next line I sent in a blood pressure medicine called nadolol. It is a 20 mg pill that she'll take once a day. The point of this medicine is to decrease the pressure in your liver to prevent worsening swollen blood vessels in your swallowing tube. We will monitor the effectiveness of this with your heart rate we are aiming to get to 55-60. We may need to increase the dose in the future.  2. We will schedule your procedure for you. 3. Return for follow-up in 4 weeks 4. Bring her paperwork related to [place] in our office.

## 2016-04-04 NOTE — Assessment & Plan Note (Signed)
The patient had an upper endoscopy for anemia while in the hospital and was found to have grade 3 esophageal varices with stigmata of bleeding. New guidelines reviewed regarding prophylaxis of recurrent variceal bleed. Recommended nonselective beta blocker and endoscopic variceal ligation. Previous endoscopy recommendations included 2-4 week follow-up and repeat endoscopy for surveillance. At this point I'll have this scheduled. We'll start Nadolol 20 mg once a day. His heart rate today is 88 and blood pressure has significant room at 129/82.  Proceed with EGD on propofol/MAC with Dr. [name] in near future: the risks, benefits, and alternatives have been discussed with the patient in detail. The patient states understanding and desires to proceed.  The patient is not on any anticoagulants, anxiolytics, chronic pain medications, or antidepressants. He does use marijuana daily. Previous endoscopy completed on propofol/MAC and I will schedule his repeat endoscopy in the same manner.

## 2016-04-04 NOTE — Assessment & Plan Note (Signed)
The patient is newly diagnosed with cirrhosis likely due to hepatitis C. His acute hepatitis panel was negative. Today I will check him for vaccine status with hepatitis B surface antibody, hepatitis C RNA reflex genotype to describe the specific hep C has, repeat hepatic function panel, PTT/INR, AFP. Recent ultrasound of his liver not suggestive of hepatocellular carcinoma although this is not definitive at a strength and with a negative AFP. Return for follow-up in 4 weeks.  I had a very lengthy discussion with the patient and his wife all about cirrhosis, hepatitis C, avoiding exacerbating factors, need for repeat endoscopy, need for nonselective beta blocker to prevent recurrent variceal bleeds.  Total time spent 40-50 minutes with over half of this on education and counseling regarding disease process.

## 2016-04-05 LAB — HEPATITIS B SURFACE ANTIBODY, QUANTITATIVE

## 2016-04-05 LAB — AFP TUMOR MARKER: AFP TUMOR MARKER: 9.5 ng/mL — AB (ref 0.0–8.3)

## 2016-04-05 NOTE — Progress Notes (Signed)
CC'D TO PCP °

## 2016-04-07 LAB — HCV RNA QUANT RFLX ULTRA OR GENOTYP
HCV RNA Qnt(log copy/mL): 4.901 log10 IU/mL
HepC Qn: 79700 IU/mL

## 2016-04-07 LAB — HEPATITIS C GENOTYPE

## 2016-04-09 ENCOUNTER — Other Ambulatory Visit: Payer: Self-pay

## 2016-04-09 DIAGNOSIS — I8500 Esophageal varices without bleeding: Secondary | ICD-10-CM

## 2016-04-10 ENCOUNTER — Encounter: Payer: Self-pay | Admitting: Nurse Practitioner

## 2016-04-10 ENCOUNTER — Telehealth: Payer: Self-pay | Admitting: Internal Medicine

## 2016-04-10 NOTE — Telephone Encounter (Signed)
Tried to call pt- NA- LMOM 

## 2016-04-10 NOTE — Telephone Encounter (Signed)
[phone]  PATIENT CALLED WITH A QUESTION ABOUT THE VACCINE.  WANTS TO KNOW IF HE CAN WAIT UNTIL January

## 2016-04-17 ENCOUNTER — Encounter: Payer: Self-pay | Admitting: Nurse Practitioner

## 2016-04-18 ENCOUNTER — Other Ambulatory Visit: Payer: Self-pay | Admitting: Nurse Practitioner

## 2016-04-18 DIAGNOSIS — K7460 Unspecified cirrhosis of liver: Secondary | ICD-10-CM

## 2016-04-18 DIAGNOSIS — I8511 Secondary esophageal varices with bleeding: Secondary | ICD-10-CM

## 2016-04-18 DIAGNOSIS — R188 Other ascites: Principal | ICD-10-CM

## 2016-04-18 MED ORDER — FUROSEMIDE 20 MG PO TABS: 20.0000 mg | ORAL_TABLET | Freq: Every day | ORAL | 3 refills | Status: DC

## 2016-04-18 MED ORDER — PANTOPRAZOLE SODIUM 40 MG PO TBEC: 40.0000 mg | DELAYED_RELEASE_TABLET | Freq: Every day | ORAL | 3 refills | Status: DC

## 2016-04-18 MED ORDER — SPIRONOLACTONE 25 MG PO TABS: 25.0000 mg | ORAL_TABLET | Freq: Two times a day (BID) | ORAL | 3 refills | Status: DC

## 2016-04-18 MED ORDER — NADOLOL 20 MG PO TABS: 20.0000 mg | ORAL_TABLET | Freq: Every day | ORAL | 3 refills | Status: DC

## 2016-04-18 NOTE — Progress Notes (Signed)
I have sent him a message via mychart. 

## 2016-04-18 NOTE — Progress Notes (Signed)
Please tell the patient refills sent to pharmacy, Keep taking all of his medications.

## 2016-04-23 NOTE — Patient Instructions (Addendum)
Benjamin MareDavid Miller  04/23/2016     @PREFPERIOPPHARMACY @   Your procedure is scheduled on  04/26/2016   [facility] at   0730  A.M.  Call this number if you have problems the morning of surgery:  [phone]   Remember:  Do not eat food or drink liquids after midnight.  Take these medicines the morning of surgery with A SIP OF WATER  Nadolol, protonix.   Do not wear jewelry, make-up or nail polish.  Do not wear lotions, powders, or perfumes, or deoderant.  Do not shave 48 hours prior to surgery.  Men may shave face and neck.  Do not bring valuables to the hospital.  [facility] any belongings or valuables.  Contacts, dentures or bridgework may not be worn into surgery.  Leave your suitcase in the car.  After surgery it may be brought to your room.  For patients admitted to the hospital, discharge time will be determined by your treatment team.  Patients discharged the day of surgery will not be allowed to drive home.   Name and phone number of your driver:   family Special instructions:  Follow the diet instructions given to you by Dr Luvenia Starchourk's office.  Please read over the following fact sheets that you were given. Anesthesia Post-op Instructions and Care and Recovery After Surgery       Esophagogastroduodenoscopy Introduction Esophagogastroduodenoscopy (EGD) is a procedure to examine the lining of the esophagus, stomach, and first part of the small intestine (duodenum). This procedure is done to check for problems such as inflammation, bleeding, ulcers, or growths. During this procedure, a long, flexible, lighted tube with a camera attached (endoscope) is inserted down the throat. Tell a health care provider about:  Any allergies you have.  All medicines you are taking, including vitamins, herbs, eye drops, creams, and over-the-counter medicines.  Any problems you or family members have had with anesthetic medicines.  Any  blood disorders you have.  Any surgeries you have had.  Any medical conditions you have.  Whether you are pregnant or may be pregnant. What are the risks? Generally, this is a safe procedure. However, problems may occur, including:  Infection.  Bleeding.  A tear (perforation) in the esophagus, stomach, or duodenum.  Trouble breathing.  Excessive sweating.  Spasms of the larynx.  A slowed heartbeat.  Low blood pressure. What happens before the procedure?  Follow instructions from your health care provider about eating or drinking restrictions.  Ask your health care provider about:  Changing or stopping your regular medicines. This is especially important if you are taking diabetes medicines or blood thinners.  Taking medicines such as aspirin and ibuprofen. These medicines can thin your blood. Do not take these medicines before your procedure if your health care provider instructs you not to.  Plan to have someone take you home after the procedure.  If you wear dentures, be ready to remove them before the procedure. What happens during the procedure?  To reduce your risk of infection, your health care team will wash or sanitize their hands.  An IV tube will be put in a vein in your hand or arm. You will get medicines and fluids through this tube.  You will be given one or more of the following:  A medicine to help you relax (sedative).  A medicine to numb the area (local anesthetic). This medicine may be sprayed into your  throat. It will make you feel more comfortable and keep you from gagging or coughing during the procedure.  A medicine for pain.  A mouth guard may be placed in your mouth to protect your teeth and to keep you from biting on the endoscope.  You will be asked to lie on your left side.  The endoscope will be lowered down your throat into your esophagus, stomach, and duodenum.  Air will be put into the endoscope. This will help your health care  provider see better.  The lining of your esophagus, stomach, and duodenum will be examined.  Your health care provider may:  Take a tissue sample so it can be looked at in a lab (biopsy).  Remove growths.  Remove objects (foreign bodies) that are stuck.  Treat any bleeding with medicines or other devices that stop tissue from bleeding.  Widen (dilate) or stretch narrowed areas of your esophagus and stomach.  The endoscope will be taken out. The procedure may vary among health care providers and hospitals. What happens after the procedure?  Your blood pressure, heart rate, breathing rate, and blood oxygen level will be monitored often until the medicines you were given have worn off.  Do not eat or drink anything until the numbing medicine has worn off and your gag reflex has returned. This information is not intended to replace advice given to you by your health care provider. Make sure you discuss any questions you have with your health care provider. Document Released: 08/24/2004 Document Revised: 09/29/2015 Document Reviewed: 03/17/2015  2017 Elsevier Esophagogastroduodenoscopy, Care After Introduction Refer to this sheet in the next few weeks. These instructions provide you with information about caring for yourself after your procedure. Your health care provider may also give you more specific instructions. Your treatment has been planned according to current medical practices, but problems sometimes occur. Call your health care provider if you have any problems or questions after your procedure. What can I expect after the procedure? After the procedure, it is common to have:  A sore throat.  Nausea.  Bloating.  Dizziness.  Fatigue. Follow these instructions at home:  Do not eat or drink anything until the numbing medicine (local anesthetic) has worn off and your gag reflex has returned. You will know that the local anesthetic has worn off when you can swallow  comfortably.  Do not drive for 24 hours if you received a medicine to help you relax (sedative).  If your health care provider took a tissue sample for testing during the procedure, make sure to get your test results. This is your responsibility. Ask your health care provider or the department performing the test when your results will be ready.  Keep all follow-up visits as told by your health care provider. This is important. Contact a health care provider if:  You cannot stop coughing.  You are not urinating.  You are urinating less than usual. Get help right away if:  You have trouble swallowing.  You cannot eat or drink.  You have throat or chest pain that gets worse.  You are dizzy or light-headed.  You faint.  You have nausea or vomiting.  You have chills.  You have a fever.  You have severe abdominal pain.  You have black, tarry, or bloody stools. This information is not intended to replace advice given to you by your health care provider. Make sure you discuss any questions you have with your health care provider. Document Released: 04/09/2012 Document Revised:  09/29/2015 Document Reviewed: 03/17/2015  2017 Elsevier  Monitored Anesthesia Care Anesthesia is a term that refers to techniques, procedures, and medicines that help a person stay safe and comfortable during a medical procedure. Monitored anesthesia care, or sedation, is one type of anesthesia. Your anesthesia specialist may recommend sedation if you will be having a procedure that does not require you to be unconscious, such as:  Cataract surgery.  A dental procedure.  A biopsy.  A colonoscopy. During the procedure, you may receive a medicine to help you relax (sedative). There are three levels of sedation:  Mild sedation. At this level, you may feel awake and relaxed. You will be able to follow directions.  Moderate sedation. At this level, you will be sleepy. You may not remember the  procedure.  Deep sedation. At this level, you will be asleep. You will not remember the procedure. The more medicine you are given, the deeper your level of sedation will be. Depending on how you respond to the procedure, the anesthesia specialist may change your level of sedation or the type of anesthesia to fit your needs. An anesthesia specialist will monitor you closely during the procedure. Let your health care provider know about:  Any allergies you have.  All medicines you are taking, including vitamins, herbs, eye drops, creams, and over-the-counter medicines.  Any use of steroids (by mouth or as a cream).  Any problems you or family members have had with sedatives and anesthetic medicines.  Any blood disorders you have.  Any surgeries you have had.  Any medical conditions you have, such as sleep apnea.  Whether you are pregnant or may be pregnant.  Any use of cigarettes, alcohol, or street drugs. What are the risks? Generally, this is a safe procedure. However, problems may occur, including:  Getting too much medicine (oversedation).  Nausea.  Allergic reaction to medicines.  Trouble breathing. If this happens, a breathing tube may be used to help with breathing. It will be removed when you are awake and breathing on your own.  Heart trouble.  Lung trouble. Before the procedure Staying hydrated  Follow instructions from your health care provider about hydration, which may include:  Up to 2 hours before the procedure - you may continue to drink clear liquids, such as water, clear fruit juice, black coffee, and plain tea. Eating and drinking restrictions  Follow instructions from your health care provider about eating and drinking, which may include:  8 hours before the procedure - stop eating heavy meals or foods such as meat, fried foods, or fatty foods.  6 hours before the procedure - stop eating light meals or foods, such as toast or cereal.  6 hours before  the procedure - stop drinking milk or drinks that contain milk.  2 hours before the procedure - stop drinking clear liquids. Medicines  Ask your health care provider about:  Changing or stopping your regular medicines. This is especially important if you are taking diabetes medicines or blood thinners.  Taking medicines such as aspirin and ibuprofen. These medicines can thin your blood. Do not take these medicines before your procedure if your health care provider instructs you not to. Tests and exams  You will have a physical exam.  You may have blood tests done to show:  How well your kidneys and liver are working.  How well your blood can clot.  General instructions  Plan to have someone take you home from the hospital or clinic.  If you will  be going home right after the procedure, plan to have someone with you for 24 hours. What happens during the procedure?  Your blood pressure, heart rate, breathing, level of pain and overall condition will be monitored.  An IV tube will be inserted into one of your veins.  Your anesthesia specialist will give you medicines as needed to keep you comfortable during the procedure. This may mean changing the level of sedation.  The procedure will be performed. After the procedure  Your blood pressure, heart rate, breathing rate, and blood oxygen level will be monitored until the medicines you were given have worn off.  Do not drive for 24 hours if you received a sedative.  You may:  Feel sleepy, clumsy, or nauseous.  Feel forgetful about what happened after the procedure.  Have a sore throat if you had a breathing tube during the procedure.  Vomit. This information is not intended to replace advice given to you by your health care provider. Make sure you discuss any questions you have with your health care provider. Document Released: 01/17/2005 Document Revised: 09/30/2015 Document Reviewed: 08/14/2015 Elsevier Interactive  Patient Education  2017 Else[employer] PATIENT INSTRUCTIONS POST-ANESTHESIA  IMMEDIATELY FOLLOWING SURGERY:  Do not drive or operate machinery for the first twenty four hours after surgery.  Do not make any important decisions for twenty four hours after surgery or while taking narcotic pain medications or sedatives.  If you develop intractable nausea and vomiting or a severe headache please notify your doctor immediately.  FOLLOW-UP:  Please make an appointment with your surgeon as instructed. You do not need to follow up with anesthesia unless specifically instructed to do so.  WOUND CARE INSTRUCTIONS (if applicable):  Keep a dry clean dressing on the anesthesia/puncture wound site if there is drainage.  Once the wound has quit draining you may leave it open to air.  Generally you should leave the bandage intact for twenty four hours unless there is drainage.  If the epidural site drains for more than 36-48 hours please call the anesthesia department.  QUESTIONS?:  Please feel free to call your physician or the hospital operator if you have any questions, and they will be happy to assist you.

## 2016-04-24 ENCOUNTER — Encounter (HOSPITAL_COMMUNITY)
Admission: RE | Admit: 2016-04-24 | Discharge: 2016-04-24 | Disposition: A | Discharge status: Home / Self Care | Payer: Self-pay | Source: Ambulatory Visit | Attending: Internal Medicine | Admitting: Internal Medicine

## 2016-04-24 ENCOUNTER — Telehealth: Payer: Self-pay

## 2016-04-24 ENCOUNTER — Encounter (HOSPITAL_COMMUNITY): Payer: Self-pay

## 2016-04-24 DIAGNOSIS — Z01810 Encounter for preprocedural cardiovascular examination: Secondary | ICD-10-CM | POA: Insufficient documentation

## 2016-04-24 DIAGNOSIS — B1920 Unspecified viral hepatitis C without hepatic coma: Secondary | ICD-10-CM | POA: Insufficient documentation

## 2016-04-24 DIAGNOSIS — K766 Portal hypertension: Secondary | ICD-10-CM | POA: Insufficient documentation

## 2016-04-24 DIAGNOSIS — E871 Hypo-osmolality and hyponatremia: Secondary | ICD-10-CM | POA: Insufficient documentation

## 2016-04-24 DIAGNOSIS — K922 Gastrointestinal hemorrhage, unspecified: Secondary | ICD-10-CM | POA: Insufficient documentation

## 2016-04-24 DIAGNOSIS — D649 Anemia, unspecified: Secondary | ICD-10-CM | POA: Insufficient documentation

## 2016-04-24 DIAGNOSIS — I498 Other specified cardiac arrhythmias: Secondary | ICD-10-CM | POA: Insufficient documentation

## 2016-04-24 DIAGNOSIS — Z01812 Encounter for preprocedural laboratory examination: Secondary | ICD-10-CM | POA: Insufficient documentation

## 2016-04-24 DIAGNOSIS — I8500 Esophageal varices without bleeding: Secondary | ICD-10-CM | POA: Insufficient documentation

## 2016-04-24 DIAGNOSIS — K7460 Unspecified cirrhosis of liver: Secondary | ICD-10-CM | POA: Insufficient documentation

## 2016-04-24 HISTORY — DX: Gastro-esophageal reflux disease without esophagitis: K21.9

## 2016-04-24 HISTORY — DX: Esophageal varices without bleeding: I85.00

## 2016-04-24 LAB — CBC WITH DIFFERENTIAL/PLATELET
BASOS PCT: 0 %
Basophils Absolute: 0 10*3/uL (ref 0.0–0.1)
Eosinophils Absolute: 0.1 10*3/uL (ref 0.0–0.7)
Eosinophils Relative: 3 %
HEMATOCRIT: 34.5 % — AB (ref 39.0–52.0)
HEMOGLOBIN: 10.9 g/dL — AB (ref 13.0–17.0)
LYMPHS PCT: 29 %
Lymphs Abs: 1.2 10*3/uL (ref 0.7–4.0)
MCH: 28.6 pg (ref 26.0–34.0)
MCHC: 31.6 g/dL (ref 30.0–36.0)
MCV: 90.6 fL (ref 78.0–100.0)
Monocytes Absolute: 0.4 10*3/uL (ref 0.1–1.0)
Monocytes Relative: 9 %
NEUTROS ABS: 2.4 10*3/uL (ref 1.7–7.7)
NEUTROS PCT: 59 %
Platelets: 57 10*3/uL — ABNORMAL LOW (ref 150–400)
RBC: 3.81 MIL/uL — AB (ref 4.22–5.81)
RDW: 14.9 % (ref 11.5–15.5)
WBC: 4 10*3/uL (ref 4.0–10.5)

## 2016-04-24 LAB — BASIC METABOLIC PANEL
ANION GAP: 3 — AB (ref 5–15)
BUN: 15 mg/dL (ref 6–20)
CALCIUM: 8.1 mg/dL — AB (ref 8.9–10.3)
CHLORIDE: 109 mmol/L (ref 101–111)
CO2: 26 mmol/L (ref 22–32)
Creatinine, Ser: 0.74 mg/dL (ref 0.61–1.24)
GFR calc non Af Amer: >60 mL/min (ref >60)
Glucose, Bld: 89 mg/dL (ref 65–99)
POTASSIUM: 3.7 mmol/L (ref 3.5–5.1)
Sodium: 138 mmol/L (ref 135–145)

## 2016-04-24 NOTE — Telephone Encounter (Signed)
Pt called and left a voicemail, asking me to call him back. I tried to call him back, NA-LM to return call.

## 2016-04-24 NOTE — Telephone Encounter (Signed)
Spoke with the pt, he wasn't sure if he was supposed to continue taking the fluid pills and the pantoprazole or not. He said the pharmacy had sent us something for refills but he only got the Nadolol. I check and both fluid pills, pantoprazole and nadolol were refilled on 04/18/16 but it looks like only the Nadolol went to the pharmacy. The others say "no print". [facility] have called [facility] [place] [facility] [place] and left a voicemail with the refills for the spironolactone, furosemide and the pantoprazole per what EG had tried to send in Epic. Pt is aware.

## 2016-04-25 NOTE — Telephone Encounter (Signed)
Agree, no further recommendations. Thanks for fixing this

## 2016-04-26 ENCOUNTER — Ambulatory Visit (HOSPITAL_COMMUNITY): Payer: Self-pay | Admitting: Anesthesiology

## 2016-04-26 ENCOUNTER — Ambulatory Visit (HOSPITAL_COMMUNITY)
Admission: RE | Admit: 2016-04-26 | Discharge: 2016-04-26 | Disposition: A | Discharge status: Home / Self Care | Payer: Self-pay | Source: Ambulatory Visit | Attending: Internal Medicine | Admitting: Internal Medicine

## 2016-04-26 ENCOUNTER — Encounter (HOSPITAL_COMMUNITY): Payer: Self-pay | Admitting: *Deleted

## 2016-04-26 ENCOUNTER — Encounter (HOSPITAL_COMMUNITY)
Admission: RE | Disposition: A | Discharge status: Home / Self Care | Payer: Self-pay | Source: Ambulatory Visit | Attending: Internal Medicine

## 2016-04-26 DIAGNOSIS — K766 Portal hypertension: Secondary | ICD-10-CM | POA: Insufficient documentation

## 2016-04-26 DIAGNOSIS — Z87891 Personal history of nicotine dependence: Secondary | ICD-10-CM | POA: Insufficient documentation

## 2016-04-26 DIAGNOSIS — K3189 Other diseases of stomach and duodenum: Secondary | ICD-10-CM | POA: Insufficient documentation

## 2016-04-26 DIAGNOSIS — K7460 Unspecified cirrhosis of liver: Secondary | ICD-10-CM | POA: Insufficient documentation

## 2016-04-26 DIAGNOSIS — I8500 Esophageal varices without bleeding: Secondary | ICD-10-CM | POA: Insufficient documentation

## 2016-04-26 DIAGNOSIS — K219 Gastro-esophageal reflux disease without esophagitis: Secondary | ICD-10-CM | POA: Insufficient documentation

## 2016-04-26 DIAGNOSIS — Z79899 Other long term (current) drug therapy: Secondary | ICD-10-CM | POA: Insufficient documentation

## 2016-04-26 HISTORY — PX: ESOPHAGEAL BANDING: SHX5518

## 2016-04-26 HISTORY — PX: ESOPHAGOGASTRODUODENOSCOPY (EGD) WITH PROPOFOL: SHX5813

## 2016-04-26 SURGERY — ESOPHAGOGASTRODUODENOSCOPY (EGD) WITH PROPOFOL
Anesthesia: Monitor Anesthesia Care

## 2016-04-26 MED ORDER — CHLORHEXIDINE GLUCONATE CLOTH 2 % EX PADS: 6.0000 | MEDICATED_PAD | Freq: Once | CUTANEOUS | Status: DC

## 2016-04-26 MED ORDER — PROPOFOL 10 MG/ML IV BOLUS
INTRAVENOUS | Status: AC
  Filled 2016-04-26: qty 40

## 2016-04-26 MED ORDER — LIDOCAINE VISCOUS 2 % MT SOLN
5.0000 mL | Freq: Two times a day (BID) | OROMUCOSAL | Status: AC
  Administered 2016-04-26 (×2): 5 mL via OROMUCOSAL

## 2016-04-26 MED ORDER — MIDAZOLAM HCL 2 MG/2ML IJ SOLN
1.0000 mg | INTRAMUSCULAR | Status: DC | PRN
  Administered 2016-04-26: 2 mg via INTRAVENOUS

## 2016-04-26 MED ORDER — MIDAZOLAM HCL 5 MG/5ML IJ SOLN
INTRAMUSCULAR | Status: DC | PRN
  Administered 2016-04-26: 2 mg via INTRAVENOUS

## 2016-04-26 MED ORDER — MIDAZOLAM HCL 2 MG/2ML IJ SOLN
INTRAMUSCULAR | Status: AC
  Filled 2016-04-26: qty 2

## 2016-04-26 MED ORDER — LACTATED RINGERS IV SOLN
INTRAVENOUS | Status: DC
  Administered 2016-04-26: 1000 mL via INTRAVENOUS

## 2016-04-26 MED ORDER — LIDOCAINE VISCOUS 2 % MT SOLN
OROMUCOSAL | Status: AC
  Filled 2016-04-26: qty 15

## 2016-04-26 MED ORDER — FENTANYL CITRATE (PF) 100 MCG/2ML IJ SOLN
INTRAMUSCULAR | Status: AC
  Filled 2016-04-26: qty 2

## 2016-04-26 MED ORDER — LIDOCAINE HCL (PF) 1 % IJ SOLN
INTRAMUSCULAR | Status: AC
  Filled 2016-04-26: qty 5

## 2016-04-26 MED ORDER — LIDOCAINE HCL (CARDIAC) 10 MG/ML IV SOLN
INTRAVENOUS | Status: DC | PRN
  Administered 2016-04-26: 50 mg via INTRAVENOUS

## 2016-04-26 MED ORDER — PROPOFOL 500 MG/50ML IV EMUL
INTRAVENOUS | Status: DC | PRN
  Administered 2016-04-26: 150 ug/kg/min via INTRAVENOUS

## 2016-04-26 MED ORDER — FENTANYL CITRATE (PF) 100 MCG/2ML IJ SOLN
25.0000 ug | INTRAMUSCULAR | Status: AC | PRN
  Administered 2016-04-26 (×2): 25 ug via INTRAVENOUS

## 2016-04-26 NOTE — Anesthesia Postprocedure Evaluation (Signed)
Anesthesia Post Note  Patient: Lynetta MareDavid Schliep  Procedure(s) Performed: Procedure(s) (LRB): ESOPHAGOGASTRODUODENOSCOPY (EGD) WITH PROPOFOL (N/A) ESOPHAGEAL BANDING  Patient location during evaluation: PACU Anesthesia Type: MAC Level of consciousness: awake and alert and oriented Pain management: pain level controlled Vital Signs Assessment: post-procedure vital signs reviewed and stable Respiratory status: spontaneous breathing Cardiovascular status: stable Postop Assessment: no signs of nausea or vomiting Anesthetic complications: no     Last Vitals:  Vitals:   04/26/16 0910 04/26/16 0915  BP: 106/64 107/67  Pulse:    Resp: 19 (!) 22  Temp:      Last Pain:  Vitals:   04/26/16 0828  TempSrc: Oral                 Ranesha Val A

## 2016-04-26 NOTE — Op Note (Signed)
[address] [facility] Patient Name: Benjamin MareDavid Culbreath Procedure Date: 04/26/2016 9:24 AM MRN: [id number] Date of Birth: [birth year] Attending MD: Gennette Pacobert Michael Rourk , MD CSN: [id number] Age: [id number] Admit Type: Outpatient Procedure:                Upper GI endoscopy with EBL Indications:              Surveillance procedure, Esophageal varices Providers:                Gennette Pacobert Michael Rourk, MD, Loma MessingLurae B. Patsy LagerAlbert RN, RN,                            Birder Robsonebra Houghton, Technician Referring MD:              Medicines:                Propofol per Anesthesia Complications:            No immediate complications. Estimated Blood Loss:     Estimated blood loss was minimal. Procedure:                Pre-Anesthesia Assessment:                           - Prior to the procedure, a History and Physical                            was performed, and patient medications and                            allergies were reviewed. The patient's tolerance of                            previous anesthesia was also reviewed. The risks                            and benefits of the procedure and the sedation                            options and risks were discussed with the patient.                            All questions were answered, and informed consent                            was obtained. Prior Anticoagulants: The patient has                            taken no previous anticoagulant or antiplatelet                            agents. ASA Grade Assessment: II - A patient with                            mild systemic disease. After reviewing the risks  and benefits, the patient was deemed in                            satisfactory condition to undergo the procedure.                           After obtaining informed consent, the endoscope was                            passed under direct vision. Throughout the                            procedure, the patient's blood pressure, pulse, and                         oxygen saturations were monitored continuously. The                            EG-[id number] ([id number]) scope was introduced through the                            and advanced to the second part of duodenum. The                            upper GI endoscopy was accomplished without                            difficulty. The patient tolerated the procedure                            well. Scope In: 9:29:06 AM Scope Out: 9:39:54 AM Total Procedure Duration: 0 hours 10 minutes 48 seconds  Findings:      Grade II - III varices were found in the lower third of the esophagus.       Scar present from prior band placement. persisting columns were       relatively short - no more than 3-4 cm in length. Four bands were       successfully placed - 1 on each column.      Portal hypertensive gastropathy was found in the entire examined stomach.      The duodenal bulb and second portion of the duodenum were normal. Impression:               - Grade II-II esophageal varices. Banded.                           - Portal hypertensive gastropathy.                           - Normal duodenal bulb and second portion of the                            duodenum.                           - No specimens collected. Moderate Sedation:  Moderate (conscious) sedation was personally administered by an       anesthesia professional. The following parameters were monitored: oxygen       saturation, heart rate, blood pressure, respiratory rate, EKG, adequacy       of pulmonary ventilation, and response to care. Total physician       intraservice time was 16 minutes. Recommendation:           - Patient has a contact number available for                            emergencies. The signs and symptoms of potential                            delayed complications were discussed with the                            patient. Return to normal activities tomorrow.                            Written discharge  instructions were provided to the                            patient.                           - Resume previous diet.                           - Continue present medications.                           - Await pathology results.                           - Repeat upper endoscopy (date not yet determined).                           - Return to GI clinic in 2 months. Procedure Code(s):        --- Professional ---                           [phone], Esophagogastroduodenoscopy, flexible,                            transoral; diagnostic, including collection of                            specimen(s) by brushing or washing, when performed                            (separate procedure) Diagnosis Code(s):        --- Professional ---                           K76.6, Portal hypertension  K31.89, Other diseases of stomach and duodenum                           I85.00, Esophageal varices without bleeding CPT copyright 2016 [employer]. All rights reserved. The codes documented in this report are preliminary and upon coder review may  be revised to meet current compliance requirements. [name]. Rourk, MD Gennette Pac, MD 04/26/2016 9:51:07 AM This report has been signed electronically. Number of Addenda: 0

## 2016-04-26 NOTE — Transfer of Care (Signed)
Immediate Anesthesia Transfer of Care Note  Patient: Benjamin Miller  Procedure(s) Performed: Procedure(s) with comments: ESOPHAGOGASTRODUODENOSCOPY (EGD) WITH PROPOFOL (N/A) - 915  ESOPHAGEAL BANDING  Patient Location: PACU  Anesthesia Type:MAC  Level of Consciousness: awake, alert , oriented and patient cooperative  Airway & Oxygen Therapy: Patient Spontanous Breathing and Patient connected to nasal cannula oxygen  Post-op Assessment: Report given to RN and Post -op Vital signs reviewed and stable  Post vital signs: Reviewed and stable  Last Vitals:  Vitals:   04/26/16 0910 04/26/16 0915  BP: 106/64 107/67  Pulse:    Resp: 19 (!) 22  Temp:      Last Pain:  Vitals:   04/26/16 0828  TempSrc: Oral      Patients Stated Pain Goal: 8 (04/26/16 0828)  Complications: No apparent anesthesia complications

## 2016-04-26 NOTE — Anesthesia Preprocedure Evaluation (Signed)
Anesthesia Evaluation  Patient identified by MRN, date of birth, ID band Patient awake    Reviewed: Allergy & Precautions, NPO status , Patient's Chart, lab work & pertinent test results  Airway Mallampati: I  TM Distance: >3 FB     Dental  (+) Poor Dentition, Chipped, Loose,    Pulmonary Current Smoker, former smoker,    breath sounds clear to auscultation       Cardiovascular negative cardio ROS   Rhythm:Regular Rate:Normal     Neuro/Psych    GI/Hepatic GERD  ,(+) Cirrhosis  (portal Htn, thrombocytopenia)      , Hepatitis -UGI bleed    Endo/Other    Renal/GU      Musculoskeletal   Abdominal   Peds  Hematology   Anesthesia Other Findings   Reproductive/Obstetrics                             Anesthesia Physical Anesthesia Plan  ASA: III  Anesthesia Plan: MAC   Post-op Pain Management:    Induction: Intravenous  Airway Management Planned: Simple Face Mask  Additional Equipment:   Intra-op Plan:   Post-operative Plan:   Informed Consent: I have reviewed the patients History and Physical, chart, labs and discussed the procedure including the risks, benefits and alternatives for the proposed anesthesia with the patient or authorized representative who has indicated his/her understanding and acceptance.     Plan Discussed with:   Anesthesia Plan Comments:         Anesthesia Quick Evaluation

## 2016-04-26 NOTE — Anesthesia Procedure Notes (Signed)
Procedure Name: MAC Performed by: ADAMS, AMY A Pre-anesthesia Checklist: Patient identified, Emergency Drugs available, Suction available, Patient being monitored and Timeout performed Oxygen Delivery Method: Simple face mask       

## 2016-04-26 NOTE — Interval H&P Note (Signed)
History and Physical Interval Note:  04/26/2016 8:39 AM  Benjamin Miller  has presented today for surgery, with the diagnosis of ESOPHAGEAL VARICES  The various methods of treatment have been discussed with the patient and family. After consideration of risks, benefits and other options for treatment, the patient has consented to  Procedure(s) with comments: ESOPHAGOGASTRODUODENOSCOPY (EGD) WITH PROPOFOL (N/A) - 915  as a surgical intervention .  The patient's history has been reviewed, patient examined, no change in status, stable for surgery.  I have reviewed the patient's chart and labs.  Questions were answered to the patient's satisfaction.     Benjamin Miller  No change. Recent platelet count down to 57,000. EGD with possible follow-up EBL per plan.  The risks, benefits, limitations, alternatives and imponderables have been reviewed with the patient. Potential for esophageal dilation, biopsy, etc. have also been reviewed.  Questions have been answered. All parties agreeable.

## 2016-04-26 NOTE — Discharge Instructions (Addendum)
Office visit with us in 2 months.   EGD Discharge instructions Please read the instructions outlined below and refer to this sheet in the next few weeks. These discharge instructions provide you with general information on caring for yourself after you leave the hospital. Your doctor may also give you specific instructions. While your treatment has been planned according to the most current medical practices available, unavoidable complications occasionally occur. If you have any problems or questions after discharge, please call your doctor. ACTIVITY  You may resume your regular activity but move at a slower pace for the next 24 hours.   Take frequent rest periods for the next 24 hours.   Walking will help expel (get rid of) the air and reduce the bloated feeling in your abdomen.   No driving for 24 hours (because of the anesthesia (medicine) used during the test).   You may shower.   Do not sign any important legal documents or operate any machinery for 24 hours (because of the anesthesia used during the test).  NUTRITION  Drink plenty of fluids.   You may resume your normal diet.   Begin with a light meal and progress to your normal diet.   Avoid alcoholic beverages for 24 hours or as instructed by your caregiver.  MEDICATIONS  You may resume your normal medications unless your caregiver tells you otherwise.  WHAT YOU CAN EXPECT TODAY  You may experience abdominal discomfort such as a feeling of fullness or gas pains.  FOLLOW-UP  Your doctor will discuss the results of your test with you.  SEEK IMMEDIATE MEDICAL ATTENTION IF ANY OF THE FOLLOWING OCCUR:  Excessive nausea (feeling sick to your stomach) and/or vomiting.   Severe abdominal pain and distention (swelling).   Trouble swallowing.   Temperature over 101 F (37.8 C).   Rectal bleeding or vomiting of blood.

## 2016-04-26 NOTE — H&P (View-Only) (Signed)
Referring Provider: Trey SailorsPa, [facility] Primary Care Physician:  Frederich ChickWEBB, CAROL D, MD Primary GI:  Dr.   Chief Complaint  Patient presents with  . other    swelling in abdomen    HPI:   Benjamin Miller is a 40 y.o. male who presents for post-hospital follow-up. The patient was admitted to the hospital on 03/17/2016 and discharged on 03/19/2016 for upper GI bleed, symptomatic anemia, liver cirrhosis with ascites. He presented to the hospital with new onset ascites secondary to underlying cirrhosis, significantly anemic and mild thrombocytopenia. His meld score was 13. Noted dark emesis and stool likely due to taking Pepto-Bismol although coexisting hemorrhage cannot be excluded. He was Hemoccult negative and transfused one unit of packed red blood cells. Reported history of positive hepatitis C. Abdominal imaging with tiny lesions in the left lobe of the liver too small to characterize, thickening of the bowel and gallbladder wall most likely related to cirrhosis and low oncotic pressure/albumin. Team is no need for urgent paracentesis, was started on 2 g sodium diet and diuretics. An upper endoscopy was offered and performed on 03/19/2016 which found grade 3 esophageal varices with bleeding stigmata status post banding, portal hypertensive gastropathy, normal duodenum.  Recommended 2 g sodium diet, consider increasing diuretics, dietary consult regarding 2 g sodium diet, repeat upper endoscopy in 4 weeks for surveillance in 2 week follow-up to establish cirrhosis care.  His hepatitis C antibody came back positive, hepatitis B surface antigen negative, hepatitis A IgM negative, hepatitis B core IgM negative.   Today he states he has been doing well. Has been avoiding heavy lifting. Denies overt abdominal pain, N/V. Occasional swelling which is typically well-managed with diuretics. Also with LE ankle edema. He states the first week out of the hospital he had significant swelling which improved  with diuretics, now having no significant abdominal swelling other than trace amounts. If he exerts himself significantly will have some dyspnea. Denies hematochezia, melena, N/V, dysphagia. Energy has been good. Denies chest pain, dyspnea, dizziness, lightheadedness, syncope, near syncope. Denies any other upper or lower GI symptoms.  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  Past Medical History:  Diagnosis Date  . Chronic hepatitis C with cirrhosis (HCC)   . Hepatitis C     Past Surgical History:  Procedure Laterality Date  . ESOPHAGOGASTRODUODENOSCOPY (EGD) WITH PROPOFOL N/A 03/19/2016   Procedure: ESOPHAGOGASTRODUODENOSCOPY (EGD) WITH PROPOFOL;  Surgeon: Corbin Adeobert M Rourk, MD;  Location: [facility];  Service: Endoscopy;  Laterality: N/A;  with propofol    Current Outpatient Prescriptions  Medication Sig Dispense Refill  . furosemide (LASIX) 20 MG tablet Take 1 tablet (20 mg total) by mouth daily. 30 tablet 0  . pantoprazole (PROTONIX) 40 MG tablet Take 1 tablet (40 mg total) by mouth daily. 30 tablet 0  . spironolactone (ALDACTONE) 25 MG tablet Take 1 tablet (25 mg total) by mouth 2 (two) times daily. 60 tablet 0   No current facility-administered medications for this visit.     Allergies as of 04/04/2016  . (No Known Allergies)    Family History  Problem Relation Age of Onset  . Colon cancer Neg Hx   . Liver cancer Neg Hx   . Liver disease Neg Hx     Social History   Social History  . Marital status: Single    Spouse name: N/A  . Number of children: N/A  . Years of education: N/A   Social History Main  Topics  . Smoking status: Former Smoker    Types: Cigarettes    Start date: 04/05/1987    Quit date: 02/03/2016  . Smokeless tobacco: Never Used  . Alcohol use No  . Drug use:     Frequency: 7.0 times per week    Types: Marijuana  . Sexual activity: Not Asked   Other Topics Concern  . None   Social History Narrative  . None    Review of  Systems: General: Negative for anorexia, weight loss, fever, chills, fatigue, weakness. ENT: Negative for hoarseness, difficulty swallowing. CV: Negative for chest pain, angina, palpitations, peripheral edema.  Respiratory: Negative for dyspnea at rest, cough, sputum, wheezing.  GI: See history of present illness. Derm: Negative for rash or itching.  Neuro: Negative for weakness, abnormal sensation, seizure, frequent headaches, memory loss, confusion.  Psych: Negative for anxiety, depression, suicidal ideation, hallucinations.  Endo: Negative for unusual weight change.  Heme: Negative for bruising or bleeding.   Physical Exam: BP 129/82   Pulse 88   Temp 97.7 F (36.5 C) (Oral)   Ht 6' (1.829 m)   Wt 188 lb 6.4 oz (85.5 kg)   BMI 25.55 kg/m  General:   Alert and oriented. Pleasant and cooperative. Well-nourished and well-developed. Appears older than stated age. Eyes:  Without icterus, sclera clear and conjunctiva pink.  Ears:  Normal auditory acuity. Cardiovascular:  S1, S2 present without murmurs appreciated. Extremities with 2+ bilateral pitting edema (R>L). Respiratory:  Clear to auscultation bilaterally. No wheezes, rales, or rhonchi. No distress.  Gastrointestinal:  +BS, rounded but soft, non-tender and mildly distended. No HSM noted. No guarding or rebound. No masses appreciated.  Rectal:  Deferred  Musculoskalatal:  Symmetrical without gross deformities. Skin:  Intact without significant lesions or rashes. Neurologic:  Alert and oriented x4;  grossly normal neurologically. Psych:  Alert and cooperative. Normal mood and affect. Heme/Lymph/Immune: No excessive bruising noted.    04/04/2016 9:56 AM   Disclaimer: This note was dictated with voice recognition software. Similar sounding words can inadvertently be transcribed and may not be corrected upon review.

## 2016-05-02 ENCOUNTER — Encounter (HOSPITAL_COMMUNITY): Payer: Self-pay | Admitting: Internal Medicine

## 2016-05-09 ENCOUNTER — Ambulatory Visit: Payer: Self-pay | Admitting: Nurse Practitioner

## 2016-05-16 ENCOUNTER — Ambulatory Visit: Payer: Self-pay | Admitting: Nurse Practitioner

## 2016-06-27 ENCOUNTER — Telehealth: Payer: Self-pay | Admitting: Nurse Practitioner

## 2016-06-27 ENCOUNTER — Ambulatory Visit (INDEPENDENT_AMBULATORY_CARE_PROVIDER_SITE_OTHER): Payer: BLUE CROSS/BLUE SHIELD | Admitting: Nurse Practitioner

## 2016-06-27 ENCOUNTER — Other Ambulatory Visit (HOSPITAL_COMMUNITY)
Admission: RE | Admit: 2016-06-27 | Discharge: 2016-06-27 | Disposition: A | Discharge status: Home / Self Care | Payer: BLUE CROSS/BLUE SHIELD | Source: Ambulatory Visit | Attending: Nurse Practitioner | Admitting: Nurse Practitioner

## 2016-06-27 ENCOUNTER — Encounter: Payer: Self-pay | Admitting: Nurse Practitioner

## 2016-06-27 VITALS — BP 125/69 | HR 58 | Temp 98.5°F | Ht 72.0 in | Wt 165.6 lb

## 2016-06-27 DIAGNOSIS — I8511 Secondary esophageal varices with bleeding: Secondary | ICD-10-CM

## 2016-06-27 DIAGNOSIS — K766 Portal hypertension: Secondary | ICD-10-CM

## 2016-06-27 DIAGNOSIS — R188 Other ascites: Secondary | ICD-10-CM

## 2016-06-27 DIAGNOSIS — K7460 Unspecified cirrhosis of liver: Secondary | ICD-10-CM | POA: Insufficient documentation

## 2016-06-27 LAB — CBC WITH DIFFERENTIAL/PLATELET
BASOS ABS: 0 10*3/uL (ref 0.0–0.1)
BASOS PCT: 0 %
Eosinophils Absolute: 0.2 10*3/uL (ref 0.0–0.7)
Eosinophils Relative: 2 %
HEMATOCRIT: 41 % (ref 39.0–52.0)
HEMOGLOBIN: 14.7 g/dL (ref 13.0–17.0)
Lymphocytes Relative: 33 %
Lymphs Abs: 2.8 10*3/uL (ref 0.7–4.0)
MCH: 29.3 pg (ref 26.0–34.0)
MCHC: 35.9 g/dL (ref 30.0–36.0)
MCV: 81.8 fL (ref 78.0–100.0)
Monocytes Absolute: 0.6 10*3/uL (ref 0.1–1.0)
Monocytes Relative: 8 %
NEUTROS ABS: 4.9 10*3/uL (ref 1.7–7.7)
NEUTROS PCT: 57 %
Platelets: 115 10*3/uL — ABNORMAL LOW (ref 150–400)
RBC: 5.01 MIL/uL (ref 4.22–5.81)
RDW: 18.4 % — AB (ref 11.5–15.5)
WBC: 8.5 10*3/uL (ref 4.0–10.5)

## 2016-06-27 LAB — COMPREHENSIVE METABOLIC PANEL
ALBUMIN: 3.9 g/dL (ref 3.5–5.0)
ALK PHOS: 49 U/L (ref 38–126)
ALT: 84 U/L — ABNORMAL HIGH (ref 17–63)
AST: 58 U/L — AB (ref 15–41)
Anion gap: 7 (ref 5–15)
BILIRUBIN TOTAL: 1.7 mg/dL — AB (ref 0.3–1.2)
BUN: 23 mg/dL — AB (ref 6–20)
CO2: 25 mmol/L (ref 22–32)
Calcium: 9 mg/dL (ref 8.9–10.3)
Chloride: 105 mmol/L (ref 101–111)
Creatinine, Ser: 1 mg/dL (ref 0.61–1.24)
GFR calc Af Amer: >60 mL/min (ref >60)
GFR calc non Af Amer: >60 mL/min (ref >60)
GLUCOSE: 116 mg/dL — AB (ref 65–99)
POTASSIUM: 3.8 mmol/L (ref 3.5–5.1)
SODIUM: 137 mmol/L (ref 135–145)
TOTAL PROTEIN: 7.5 g/dL (ref 6.5–8.1)

## 2016-06-27 LAB — AMMONIA: Ammonia: 21 umol/L (ref 9–35)

## 2016-06-27 NOTE — Assessment & Plan Note (Addendum)
Noted portal hypertension with associated esophageal varices with somewhat recent admission for variceal bleeding. He had banding at that time. A four-week repeat endoscopy showed persistent grade 2-3 varices which were again banded. Discussed with Dr. [name] and we'll plan to have a repeat endoscopy in the next month for surveillance and banding as necessary. His ascites has resolved as has his lower extremity edema on diuretics. Good heart rate for nadolol treatment and prophylaxis of recurrent variceal bleed. Return for follow-up in 3 months for routine cirrhosis care.

## 2016-06-27 NOTE — Patient Instructions (Signed)
1. Get your hepatitis B vaccine as soon as she can. It is a 2-3 shots series. 2. Continue taking your current medications. 3. Have your labs drawn when you're able to. 4. Call us for any significant changes. 5. Return for follow-up in 3 months. 6. We will call you with an update on where we are at for hepatitis C viral treatment. 7. We will call you to update you when we need to perform your next upper endoscopy.  8. Call if you have any questions

## 2016-06-27 NOTE — Assessment & Plan Note (Signed)
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 Not currently using drugs. We have submitted his situation to drug manufacturer for patient assistance. He has yet to obtain hepatitis B vaccinations. I recommended he obtain these vaccinations. I will check labs today including CBC, CMP, and ammonia. We will call notify him of the status of his pending treatment. Return for follow-up in 3 months for routine cirrhosis care.

## 2016-06-27 NOTE — Telephone Encounter (Signed)
Please notify the patient Dr. Gala Romney recommends repeat EGD with possible banding in the next month to relook for those swollen blood vessels. Please proceed with scheduling EGD +/- variceal banding on Propofol/MAC.

## 2016-06-27 NOTE — Telephone Encounter (Signed)
Can we check the status of his HCV treatment? Let me know if there's anything I need to do.

## 2016-06-27 NOTE — Assessment & Plan Note (Addendum)
Esophageal varices status post banding 2 rounds. Per Dr. Gala Romney recommend a repeat endoscopy for surveillance and banding as necessary in the next month. We will notify the patient and proceed with scheduling over the phone. On nonselective beta blocker for variceal bleeding prophylaxis with a good heart rate at 58 today.  Proceed with EGD on propofol/MAC with Dr. Gala Romney in near future: the risks, benefits, and alternatives have been discussed with the patient in detail. The patient states understanding and desires to proceed.  The patient is currently on Celexa. No other anticoagulants, anxiolytics, chronic pain medications, or antidepressants. History of drug use, no current use. No alcohol. Previous endoscopy was on propofol/MAC and I will schedule this upcoming procedure in the same manner.

## 2016-06-27 NOTE — Progress Notes (Signed)
Referring Provider: Shirlean Mylar, MD Primary Care Physician:  Frederich Chick, MD Primary GI:  Dr. Jena Gauss  Chief Complaint  Patient presents with  . Cirrhosis    HPI:   Benjamin Miller is a 41 y.o. male who presents For follow-up on cirrhosis and esophageal varices. Recently diagnosed with cirrhosis during hospital admission from 03/17/2016 through 03/19/2016 for upper GI bleed and symptomatic anemia. History of positive hepatitis C. Abdominal imaging with tiny lesions in the left lobe too small to characterize. No need for paracentesis at that time, started on 2 g sodium diet and diuretics. Upper endoscopy 03/19/2016 found grade 3 esophageal varices with bleeding stigmata status post banding, portal hypertensive gastropathy, normal duodenum. Recommended repeat endoscopy in 4 weeks. His hepatitis C antibody came back positive, hepatitis B surface antigen negative, hepatitis A IgM negative, hepatitis B core IgM negative.  At the time of his last visit he stated he had been doing well, occasional swelling managed well with diuretics. Some dyspnea on exertion. 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 Repeat labs after last visit showed nonimmune to hepatitis B. Genotype 1A hepatitis C. He was provided a prescription for hepatitis B vaccination. Other liver functions normal. AFP tumor marker elevated at 9.5 but right upper quadrant ultrasound found cirrhosis of liver with moderate ascites and no suspicious lesions. He was started on nonselective beta blocker.  Repeat EGD was performed 04/26/2016 on propofol and found grade 2-3 varices in the lower third of the esophagus, scar noted from prior band placement, persistent columns relatively short approximately 3-4 cm in length. 4 bands were applied, one on each column. Again noted portal hypertensive gastropathy in the entire stomach, duodenal bulb and rest of duodenum was normal. Recommended continue present medications, repeat upper endoscopy at  an undetermined date.  Today he states his last EGD went well. Is on Nadolol and tolerating it well. Hasn't done the hepatitis vaccine series. Has insurance straightened out. Is on antidepressants now but is having mood swings with agressiveness and tearfulness. His HR is between 55-60 today. Denies abdominal pain, N/V, hematochezia, melena. Has had some decreased appetite. Is under a lot of stress. Denies yellowing skin/eyes, darkened urine. Thinks he's had some acute confusion recently, thinks it may be related to his antidepressant. Has a bowel movement daily. No LE edema, no abdominal swelling. Denies chest pain, dyspnea, dizziness, lightheadedness, syncope, near syncope. Denies any other upper or lower GI symptoms.    Past Medical History:  Diagnosis Date  . Chronic hepatitis C with cirrhosis (HCC)   . Esophageal varices determined by endoscopy (HCC)   . GERD (gastroesophageal reflux disease)   . Hepatitis C     Past Surgical History:  Procedure Laterality Date  . ESOPHAGEAL BANDING  04/26/2016   Procedure: ESOPHAGEAL BANDING;  Surgeon: Corbin Ade, MD;  [facility];  Service: Endoscopy;;  . ESOPHAGOGASTRODUODENOSCOPY (EGD) WITH PROPOFOL N/A 03/19/2016   Procedure: ESOPHAGOGASTRODUODENOSCOPY (EGD) WITH PROPOFOL;  Surgeon: Corbin Ade, MD;  [facility];  Service: Endoscopy;  Laterality: N/A;  with propofol  . ESOPHAGOGASTRODUODENOSCOPY (EGD) WITH PROPOFOL N/A 04/26/2016   Procedure: ESOPHAGOGASTRODUODENOSCOPY (EGD) WITH PROPOFOL;  Surgeon: Corbin Ade, MD;  [facility];  Service: Endoscopy;  Laterality: N/A;  915     Current Outpatient Prescriptions  Medication Sig Dispense Refill  . citalopram (CELEXA) 20 MG tablet Take 20 mg by mouth daily.    . furosemide (LASIX) 20 MG tablet Take  1 tablet (20 mg total) by mouth daily. 30 tablet 3  . nadolol (CORGARD) 20 MG tablet Take 1 tablet (20 mg total) by mouth daily. (Patient taking differently:  Take 20 mg by mouth every evening. ) 30 tablet 3  . pantoprazole (PROTONIX) 40 MG tablet Take 1 tablet (40 mg total) by mouth daily. 30 tablet 3  . spironolactone (ALDACTONE) 25 MG tablet Take 1 tablet (25 mg total) by mouth 2 (two) times daily. 60 tablet 3   No current facility-administered medications for this visit.     Allergies as of 06/27/2016  . (No Known Allergies)    Family History  Problem Relation Age of Onset  . Colon cancer Neg Hx   . Liver cancer Neg Hx   . Liver disease Neg Hx     Social History   Social History  . Marital status: Single    Spouse name: N/A  . Number of children: N/A  . Years of education: N/A   Social History Main Topics  . Smoking status: Current Every Day Smoker    Packs/day: 0.25    Years: 20.00    Types: Cigarettes    Start date: 04/05/1987  . Smokeless tobacco: Never Used  . Alcohol use No  . Drug use: Yes    Frequency: 7.0 times per week    Types: Marijuana  . Sexual activity: Yes    Birth control/ protection: None   Other Topics Concern  . None   Social History Narrative  . None    Review of Systems: General: Negative for anorexia, weight loss, fever, chills, fatigue, weakness. ENT: Negative for hoarseness, difficulty swallowing. CV: Negative for chest pain, angina, palpitations, peripheral edema.  Respiratory: Negative for dyspnea at rest, cough, sputum, wheezing.  GI: See history of present illness. Endo: Negative for unusual weight change.  Heme: Negative for bruising or bleeding.   Physical Exam: BP 125/69   Pulse (!) 58   Temp 98.5 F (36.9 C) (Oral)   Ht 6' (1.829 m)   Wt 165 lb 9.6 oz (75.1 kg)   BMI 22.46 kg/m  General:   Alert and oriented. Pleasant and cooperative. Well-nourished and well-developed.  Head:  Normocephalic and atraumatic. Eyes:  Without icterus, sclera clear and conjunctiva pink.  Ears:  Normal auditory acuity. Cardiovascular:  S1, S2 present without murmurs appreciated. Extremities  without clubbing or edema. Respiratory:  Clear to auscultation bilaterally. No wheezes, rales, or rhonchi. No distress.  Gastrointestinal:  +BS, soft, non-tender and non-distended. No HSM noted. No guarding or rebound. No masses appreciated.  Rectal:  Deferred  Musculoskalatal:  Symmetrical without gross deformities. Neurologic:  Alert and oriented x4;  grossly normal neurologically. Psych:  Alert and cooperative. Normal mood and affect. Heme/Lymph/Immune: No excessive bruising noted.    06/27/2016 10:47 AM   Disclaimer: This note was dictated with voice recognition software. Similar sounding words can inadvertently be transcribed and may not be corrected upon review.

## 2016-06-28 ENCOUNTER — Telehealth: Payer: Self-pay

## 2016-06-28 ENCOUNTER — Encounter: Payer: Self-pay | Admitting: Nurse Practitioner

## 2016-06-28 NOTE — Telephone Encounter (Signed)
Pt returned forms and he only signed them, he did not fill them out and did not bring income information. Forms were mailed back to the pt and I sent a mychart message which the pt reviewed.  Pt never returned forms. Pt now has [employer] [place], he will need to get rx thru [facility].

## 2016-06-28 NOTE — Telephone Encounter (Signed)
LMOM to call back

## 2016-06-28 NOTE — Telephone Encounter (Signed)
Noted, no further recommendations. 

## 2016-06-28 NOTE — Telephone Encounter (Signed)
Paperwork on EG desk. 

## 2016-06-28 NOTE — Progress Notes (Signed)
CC'ED TO PCP 

## 2016-07-02 NOTE — Telephone Encounter (Signed)
Letter mailed out for him to call us

## 2016-07-02 NOTE — Telephone Encounter (Signed)
Tried to call but no answer

## 2016-07-02 NOTE — Telephone Encounter (Signed)
GF tried to call pt 06/28/16. See separate phone note.

## 2016-07-04 NOTE — Telephone Encounter (Signed)
Pt called back and he will be pass his 30 days on 07/06/16. Do we need to bring him back into the office to schedule his EGD+/- banding with PROPOFOL? Please advise

## 2016-07-04 NOTE — Telephone Encounter (Signed)
I just saw him on 2/21 (the same day RMR said repeat within the next month). So we should be good if we can get him scheduled before 3/21. Do you need sedation orders?

## 2016-07-04 NOTE — Telephone Encounter (Signed)
All I have is going to put him pass his 30 days(07/27/16). We could not get in touch with him that is why it took so long. The first thing we have is 07/30/16.

## 2016-07-09 ENCOUNTER — Encounter: Payer: Self-pay | Admitting: Nurse Practitioner

## 2016-07-09 NOTE — Telephone Encounter (Signed)
If it's an RMR patient we will need to see him again in the office.

## 2016-07-09 NOTE — Telephone Encounter (Signed)
Pt is coming in on 07/24/16 @ 11:00

## 2016-07-11 ENCOUNTER — Telehealth: Payer: Self-pay | Admitting: Internal Medicine

## 2016-07-11 NOTE — Telephone Encounter (Signed)
[facility] called to say that the patient's Harvoni will be delivered to us tomorrow morning via [employer].

## 2016-07-11 NOTE — Telephone Encounter (Signed)
Minerva AreolaEric, what instructions do you want me to give him for his harvoni?

## 2016-07-11 NOTE — Telephone Encounter (Signed)
Take a pill once a day.  He needs to STOP Protonix (they can interact and make the Harvoni less effective). Notify us if his GERD is intolerable while on Harvoni so we can decide on other options.   DO NOT start any other medications (prescription, OTC, herbs, supplements, or other) without checking with us first.  Please have him sign the standard form related to medications currently being used and understanding to not start new medicines.  Let me know if any other questions.

## 2016-07-12 NOTE — Telephone Encounter (Signed)
Patient stated he will pick up his medication up today.

## 2016-07-12 NOTE — Telephone Encounter (Signed)
Instructions are done and the medication is at the front desk for the pt to pick up.

## 2016-07-19 ENCOUNTER — Telehealth: Payer: Self-pay

## 2016-07-19 ENCOUNTER — Other Ambulatory Visit: Payer: Self-pay

## 2016-07-19 DIAGNOSIS — K7460 Unspecified cirrhosis of liver: Secondary | ICD-10-CM

## 2016-07-19 NOTE — Telephone Encounter (Signed)
I received a VM from [place] with [employer]. Her call back number is [phone].   She said pt has signed up for some assistance and she will be glad to work with him on anything that Wynne DustEric Gill, NP would want her to assist pt with.  Please let her know how she can best help the pt.

## 2016-07-19 NOTE — Telephone Encounter (Signed)
Pt called because he took 2 Harvoni pills this morning by mistake. He took one at 8:30 am then he took another one at 9:45 am. He is wanting to know what needs to do? Please advise

## 2016-07-19 NOTE — Telephone Encounter (Signed)
Spoke with Maylene RoesStacey Lee [phone]), medical scientist with Harvoni.  No antidote available, but patient should have renal function checked. He also should be seen in the clinic TODAY OR TOMORROW AT ABSOLUTE LATEST.   1. Check renal function stat today (as those with decreased renal function may have increased uptake of metabolite). He does not have baseline renal insufficiency, so this should not be an issue.   2. Do not need to stop the drug. DO NOT TAKE ANY MORE DOSES TODAY. Take dose as normal tomorrow but TAKE AT LUNCH. Then Saturday TAKE AT NORMAL TIME.  I'm routing to all providers Minerva Areola(Eric and Verlon AuLeslie) as Misty StanleyStacey will be speaking with the pharmacist and looking through information further regarding phase 1 clinical trials and if double dosing was ever given during these studies. For now though, please have patient seen just so we can document how he is.

## 2016-07-19 NOTE — Telephone Encounter (Signed)
UPDATE:  Maylene RoesStacey Lee, medical scientist [phone]) called back and states patient will need an EKG. He needs to go to the ED instead of doing stat labs as an outpatient as there is a potential for prolonged QT interval with higher doses of Harvoni. We do not have this capability here at the office.   [facility] with following recommendations :  1. Needs assessment of renal function (will likely remain normal but still need documentation of this) 2. Needs EKG  Patient is aware and will go to the ED. Per [facility] medical scientist, there is not an antidote. If all checks out well, which I am hoping it will as this was a one-time event, then he will need to take his dose tomorrow morning as already planned.

## 2016-07-19 NOTE — Telephone Encounter (Signed)
Pt is aware and he is on the way to have the labs done now. He will see EG tomorrow at 11:30 am.

## 2016-07-20 ENCOUNTER — Ambulatory Visit: Payer: BLUE CROSS/BLUE SHIELD | Admitting: Nurse Practitioner

## 2016-07-20 NOTE — Telephone Encounter (Signed)
[facility] yesterday afternoon at approx 3:45pm and wanted to let Tobi Bastosnna know that pt had not came to the ED. GF notified Tobi Bastosnna via phone.

## 2016-07-24 ENCOUNTER — Ambulatory Visit (INDEPENDENT_AMBULATORY_CARE_PROVIDER_SITE_OTHER): Payer: BLUE CROSS/BLUE SHIELD | Admitting: Nurse Practitioner

## 2016-07-24 ENCOUNTER — Other Ambulatory Visit: Payer: Self-pay

## 2016-07-24 ENCOUNTER — Encounter: Payer: Self-pay | Admitting: Nurse Practitioner

## 2016-07-24 ENCOUNTER — Telehealth: Payer: Self-pay

## 2016-07-24 VITALS — BP 116/71 | HR 71 | Temp 98.2°F | Ht 72.0 in | Wt 165.8 lb

## 2016-07-24 DIAGNOSIS — I8511 Secondary esophageal varices with bleeding: Secondary | ICD-10-CM

## 2016-07-24 DIAGNOSIS — K766 Portal hypertension: Secondary | ICD-10-CM

## 2016-07-24 DIAGNOSIS — B182 Chronic viral hepatitis C: Secondary | ICD-10-CM | POA: Insufficient documentation

## 2016-07-24 DIAGNOSIS — I8500 Esophageal varices without bleeding: Secondary | ICD-10-CM

## 2016-07-24 DIAGNOSIS — K7460 Unspecified cirrhosis of liver: Secondary | ICD-10-CM

## 2016-07-24 NOTE — Progress Notes (Signed)
CC'ED TO PCP 

## 2016-07-24 NOTE — Assessment & Plan Note (Addendum)
Continues on her bony treatment. He did take a double dose last Friday and was recommended to proceed to the emergency department based on recommendations from the manufacturer due to potential electrical abnormalities in the heart as well as decreased kidney function. The patient did not follow through with recommendations. Fortunately, no adverse effects occurred. I have discussed with him the importance to follow recommendations as they're based on potential major issues.  He continues on Harvoni once a day, which he started 07/12/16. No need for blood work today. He will need CBC, BMP, HSP, HCV RNA after 4 weeks of treatment, on/about 08/12/2016. Return for follow-up at the end of treatment. He is currently set for 12 weeks of therapy.

## 2016-07-24 NOTE — Telephone Encounter (Signed)
Called pt and informed of pre-op appt 08/22/16 at 10:00am. Letter also mailed.

## 2016-07-24 NOTE — Patient Instructions (Addendum)
1. You will need to have blood work completed on her about 08/12/2016. 2. Continue taking Harvoni. 3. It is okay to drink ginseng tea and take Adderall. 4. Return for follow-up at the end of your treatment (3 months) of Harvoni for additional labs. 5. We will schedule your upper endoscopy for you.

## 2016-07-24 NOTE — Progress Notes (Signed)
Referring Provider: Shirlean Mylar, MD Primary Care Physician:  Frederich Chick, MD Primary GI:  Dr. Jena Gauss  Chief Complaint  Patient presents with  . Cirrhosis    HPI:   Benjamin Miller is a 41 y.o. male who presents For follow-up on cirrhosis and portal hypertension, currently undergoing Harvoni treatment for hepatitis C. He was last seen in our office 06/27/2016. At that time he was doing well, on nadolol and tolerating well. Heart rate was at goal 55-60 beats per minute. Was having some intermittent confusion which she feels is likely related to his antidepressant. No other GI symptoms.  Recent endoscopy performed 04/26/2016 on propofol and found grade 2-3 varices in the lower third of the esophagus, scar noted from prior band placement, persistent columns relatively short approximately 3-4 cm in length. 4 bands were applied, one on each column. Again noted portal hypertensive gastropathy in the entire stomach, duodenal bulb and rest of duodenum was normal. Recommended continue present medications, repeat upper endoscopy at an undetermined date. Discussed with the endoscopist to recommend a follow-up/repeat EGD in March 2018 given significant varices with 4 bands placed.  Today he states he's doing well. Is still on Harvoni. His mind is "clearing up a bit" since coming off of antidepressant. Denies hematochezia, melena, N/V. Denies yellowing of skin/eyes, darkened urine. Denies chest pain, dyspnea, dizziness, lightheadedness, syncope, near syncope. Denies any other upper or lower GI symptoms.  Past Medical History:  Diagnosis Date  . Chronic hepatitis C with cirrhosis (HCC)   . Esophageal varices determined by endoscopy (HCC)   . GERD (gastroesophageal reflux disease)   . Hepatitis C     Past Surgical History:  Procedure Laterality Date  . ESOPHAGEAL BANDING  04/26/2016   Procedure: ESOPHAGEAL BANDING;  Surgeon: Corbin Ade, MD;  [facility];  Service: Endoscopy;;  .  ESOPHAGOGASTRODUODENOSCOPY (EGD) WITH PROPOFOL N/A 03/19/2016   Procedure: ESOPHAGOGASTRODUODENOSCOPY (EGD) WITH PROPOFOL;  Surgeon: Corbin Ade, MD;  [facility];  Service: Endoscopy;  Laterality: N/A;  with propofol  . ESOPHAGOGASTRODUODENOSCOPY (EGD) WITH PROPOFOL N/A 04/26/2016   Procedure: ESOPHAGOGASTRODUODENOSCOPY (EGD) WITH PROPOFOL;  Surgeon: Corbin Ade, MD;  [facility];  Service: Endoscopy;  Laterality: N/A;  915     Current Outpatient Prescriptions  Medication Sig Dispense Refill  . amphetamine-dextroamphetamine (ADDERALL) 10 MG tablet Take 10 mg by mouth daily with breakfast.    . furosemide (LASIX) 20 MG tablet Take 1 tablet (20 mg total) by mouth daily. 30 tablet 3  . HARVONI 90-400 MG TABS   2  . nadolol (CORGARD) 20 MG tablet Take 1 tablet (20 mg total) by mouth daily. (Patient taking differently: Take 20 mg by mouth every evening. ) 30 tablet 3  . pantoprazole (PROTONIX) 40 MG tablet Take 1 tablet (40 mg total) by mouth daily. 30 tablet 3  . spironolactone (ALDACTONE) 25 MG tablet Take 1 tablet (25 mg total) by mouth 2 (two) times daily. 60 tablet 3   No current facility-administered medications for this visit.     Allergies as of 07/24/2016  . (No Known Allergies)    Family History  Problem Relation Age of Onset  . Colon cancer Neg Hx   . Liver cancer Neg Hx   . Liver disease Neg Hx     Social History   Social History  . Marital status: Single    Spouse name: N/A  . Number of children: N/A  . Years of education:  N/A   Social History Main Topics  . Smoking status: Current Every Day Smoker    Packs/day: 0.25    Years: 20.00    Types: Cigarettes    Start date: 04/05/1987  . Smokeless tobacco: Never Used  . Alcohol use No  . Drug use: Yes    Frequency: 7.0 times per week    Types: Marijuana  . Sexual activity: Yes    Birth control/ protection: None   Other Topics Concern  . None   Social History Narrative  . None     Review of Systems: General: Negative for anorexia, weight loss, fever, chills, fatigue, weakness. ENT: Negative for hoarseness, difficulty swallowing. CV: Negative for chest pain, angina, palpitations, dyspnea on exertion, peripheral edema.  Respiratory: Negative for dyspnea at rest, dyspnea on exertion, cough, sputum, wheezing.  GI: See history of present illness. GU:  Negative for darkened urine.  Derm: Negative for generalized pruritis.  Neuro: Negative for memory loss, confusion.  Endo: Negative for unusual weight change.  Heme: Negative for bruising or bleeding. Allergy: Negative for rash or hives.   Physical Exam: BP 116/71   Pulse 71   Temp 98.2 F (36.8 C) (Oral)   Ht 6' (1.829 m)   Wt 165 lb 12.8 oz (75.2 kg)   BMI 22.49 kg/m  General:   Alert and oriented. Pleasant and cooperative. Well-nourished and well-developed.  Head:  Normocephalic and atraumatic. Eyes:  Without icterus, sclera clear and conjunctiva pink.  Ears:  Normal auditory acuity. Cardiovascular:  S1, S2 present without murmurs appreciated. Extremities without clubbing or edema. Respiratory:  Clear to auscultation bilaterally. No wheezes, rales, or rhonchi. No distress.  Gastrointestinal:  +BS, soft, non-tender and non-distended. Liver border noted 2-3 fingerbreadths below the right costal margin. No guarding or rebound. No masses appreciated.  Rectal:  Deferred  Musculoskalatal:  Symmetrical without gross deformities. Neurologic:  Alert and oriented x4;  grossly normal neurologically. Psych:  Alert and cooperative. Normal mood and affect. Heme/Lymph/Immune: No excessive bruising noted.    07/24/2016 12:16 PM   Disclaimer: This note was dictated with voice recognition software. Similar sounding words can inadvertently be transcribed and may not be corrected upon review.

## 2016-07-24 NOTE — Assessment & Plan Note (Signed)
Noted portal hypertension with hypertensive gastropathy at previous endoscopy performed 04/26/2016. He is due for repeat endoscopy at this time due to varices, as noted below.

## 2016-07-24 NOTE — Assessment & Plan Note (Addendum)
Last endoscopy completed 04/26/2016 on propofol with grade 2-3 varices in the lower third of the esophagus, scar from prior band placement and persistent columns of varices noted. 4 bands were applied, one on each column. Recommended repeat EGD with possible banding March 2018. He is currently on Corgard 20 mg daily and previous heart rate well-controlled at 55-60. Today's heart rate is 71. If it persists an elevation we will likely need to increase his Corgard dosing.  Proceed with EGD on propofol/MAC with Dr. Gala Romney in near future: the risks, benefits, and alternatives have been discussed with the patient in detail. The patient states understanding and desires to proceed.  The patient is not on any anticoagulants, anxiolytics, chronic pain medications, or antidepressants. History of drug use, no current use. No alcohol. Previous endoscopy was on propofol/MAC and I will schedule this upcoming procedure in the same manner.

## 2016-07-24 NOTE — Telephone Encounter (Signed)
noted 

## 2016-07-26 ENCOUNTER — Other Ambulatory Visit: Payer: Self-pay

## 2016-07-26 DIAGNOSIS — I8511 Secondary esophageal varices with bleeding: Secondary | ICD-10-CM

## 2016-07-26 DIAGNOSIS — K766 Portal hypertension: Secondary | ICD-10-CM

## 2016-07-26 DIAGNOSIS — B182 Chronic viral hepatitis C: Secondary | ICD-10-CM

## 2016-07-26 DIAGNOSIS — K7460 Unspecified cirrhosis of liver: Secondary | ICD-10-CM

## 2016-08-02 ENCOUNTER — Telehealth: Payer: Self-pay

## 2016-08-02 NOTE — Telephone Encounter (Signed)
pts harvoni arrived. Pt had already called this morning and talked to Darlina RumpfMartina and is aware that it was coming today.

## 2016-08-08 ENCOUNTER — Telehealth: Payer: Self-pay | Admitting: Internal Medicine

## 2016-08-08 ENCOUNTER — Other Ambulatory Visit (HOSPITAL_COMMUNITY)
Admission: RE | Admit: 2016-08-08 | Discharge: 2016-08-08 | Disposition: A | Discharge status: Home / Self Care | Payer: BLUE CROSS/BLUE SHIELD | Source: Ambulatory Visit | Attending: Nurse Practitioner | Admitting: Nurse Practitioner

## 2016-08-08 DIAGNOSIS — B182 Chronic viral hepatitis C: Secondary | ICD-10-CM | POA: Insufficient documentation

## 2016-08-08 DIAGNOSIS — K7460 Unspecified cirrhosis of liver: Secondary | ICD-10-CM | POA: Diagnosis present

## 2016-08-08 DIAGNOSIS — I8511 Secondary esophageal varices with bleeding: Secondary | ICD-10-CM | POA: Diagnosis present

## 2016-08-08 DIAGNOSIS — K766 Portal hypertension: Secondary | ICD-10-CM | POA: Diagnosis not present

## 2016-08-08 LAB — CBC WITH DIFFERENTIAL/PLATELET
Basophils Absolute: 0 10*3/uL (ref 0.0–0.1)
Basophils Relative: 0 %
EOS PCT: 2 %
Eosinophils Absolute: 0.2 10*3/uL (ref 0.0–0.7)
HEMATOCRIT: 43.1 % (ref 39.0–52.0)
HEMOGLOBIN: 15.5 g/dL (ref 13.0–17.0)
LYMPHS ABS: 2.5 10*3/uL (ref 0.7–4.0)
Lymphocytes Relative: 34 %
MCH: 31.9 pg (ref 26.0–34.0)
MCHC: 36 g/dL (ref 30.0–36.0)
MCV: 88.7 fL (ref 78.0–100.0)
MONOS PCT: 8 %
Monocytes Absolute: 0.6 10*3/uL (ref 0.1–1.0)
Neutro Abs: 4.2 10*3/uL (ref 1.7–7.7)
Neutrophils Relative %: 56 %
Platelets: 96 10*3/uL — ABNORMAL LOW (ref 150–400)
RBC: 4.86 MIL/uL (ref 4.22–5.81)
RDW: 15.9 % — ABNORMAL HIGH (ref 11.5–15.5)
WBC: 7.5 10*3/uL (ref 4.0–10.5)

## 2016-08-08 LAB — BASIC METABOLIC PANEL
Anion gap: 10 (ref 5–15)
BUN: 18 mg/dL (ref 6–20)
CHLORIDE: 102 mmol/L (ref 101–111)
CO2: 26 mmol/L (ref 22–32)
Calcium: 9.2 mg/dL (ref 8.9–10.3)
Creatinine, Ser: 0.96 mg/dL (ref 0.61–1.24)
GFR calc Af Amer: >60 mL/min (ref >60)
GFR calc non Af Amer: >60 mL/min (ref >60)
GLUCOSE: 100 mg/dL — AB (ref 65–99)
POTASSIUM: 4.2 mmol/L (ref 3.5–5.1)
Sodium: 138 mmol/L (ref 135–145)

## 2016-08-08 LAB — HEPATIC FUNCTION PANEL
ALK PHOS: 62 U/L (ref 38–126)
ALT: 39 U/L (ref 17–63)
AST: 34 U/L (ref 15–41)
Albumin: 4 g/dL (ref 3.5–5.0)
BILIRUBIN DIRECT: 0.2 mg/dL (ref 0.1–0.5)
BILIRUBIN INDIRECT: 1.1 mg/dL — AB (ref 0.3–0.9)
BILIRUBIN TOTAL: 1.3 mg/dL — AB (ref 0.3–1.2)
TOTAL PROTEIN: 7.4 g/dL (ref 6.5–8.1)

## 2016-08-08 NOTE — Telephone Encounter (Signed)
Pt said he was going to the lab today to have his blood work done and to make sure his orders were there.

## 2016-08-08 NOTE — Telephone Encounter (Signed)
pts lab orders are at [facility].

## 2016-08-08 NOTE — Telephone Encounter (Signed)
I have also faxed the orders to the hospital lab.

## 2016-08-09 ENCOUNTER — Other Ambulatory Visit: Payer: Self-pay | Admitting: Internal Medicine

## 2016-08-09 LAB — HCV RNA QUANT: HCV Quantitative: <15 IU/mL — ABNORMAL LOW (ref >50)

## 2016-08-13 ENCOUNTER — Ambulatory Visit: Payer: BLUE CROSS/BLUE SHIELD | Admitting: Nurse Practitioner

## 2016-08-20 ENCOUNTER — Other Ambulatory Visit: Payer: Self-pay | Admitting: Nurse Practitioner

## 2016-08-22 ENCOUNTER — Encounter (HOSPITAL_COMMUNITY)
Admission: RE | Admit: 2016-08-22 | Discharge: 2016-08-22 | Disposition: A | Discharge status: Home / Self Care | Payer: BLUE CROSS/BLUE SHIELD | Source: Ambulatory Visit | Attending: Internal Medicine | Admitting: Internal Medicine

## 2016-08-22 ENCOUNTER — Encounter (HOSPITAL_COMMUNITY): Payer: Self-pay

## 2016-08-22 ENCOUNTER — Inpatient Hospital Stay (HOSPITAL_COMMUNITY): Admission: RE | Admit: 2016-08-22 | Payer: BLUE CROSS/BLUE SHIELD | Source: Ambulatory Visit

## 2016-08-27 ENCOUNTER — Ambulatory Visit (HOSPITAL_COMMUNITY): Payer: BLUE CROSS/BLUE SHIELD | Admitting: Anesthesiology

## 2016-08-27 ENCOUNTER — Encounter (HOSPITAL_COMMUNITY): Payer: Self-pay | Admitting: *Deleted

## 2016-08-27 ENCOUNTER — Ambulatory Visit (HOSPITAL_COMMUNITY)
Admission: RE | Admit: 2016-08-27 | Discharge: 2016-08-27 | Disposition: A | Discharge status: Home / Self Care | Payer: BLUE CROSS/BLUE SHIELD | Source: Ambulatory Visit | Attending: Internal Medicine | Admitting: Internal Medicine

## 2016-08-27 ENCOUNTER — Encounter (HOSPITAL_COMMUNITY)
Admission: RE | Disposition: A | Discharge status: Home / Self Care | Payer: Self-pay | Source: Ambulatory Visit | Attending: Internal Medicine

## 2016-08-27 DIAGNOSIS — F17210 Nicotine dependence, cigarettes, uncomplicated: Secondary | ICD-10-CM | POA: Insufficient documentation

## 2016-08-27 DIAGNOSIS — B182 Chronic viral hepatitis C: Secondary | ICD-10-CM | POA: Insufficient documentation

## 2016-08-27 DIAGNOSIS — Z79899 Other long term (current) drug therapy: Secondary | ICD-10-CM | POA: Insufficient documentation

## 2016-08-27 DIAGNOSIS — K7460 Unspecified cirrhosis of liver: Secondary | ICD-10-CM | POA: Insufficient documentation

## 2016-08-27 DIAGNOSIS — K3189 Other diseases of stomach and duodenum: Secondary | ICD-10-CM | POA: Insufficient documentation

## 2016-08-27 DIAGNOSIS — I8510 Secondary esophageal varices without bleeding: Secondary | ICD-10-CM | POA: Diagnosis not present

## 2016-08-27 DIAGNOSIS — I8500 Esophageal varices without bleeding: Secondary | ICD-10-CM | POA: Diagnosis not present

## 2016-08-27 DIAGNOSIS — K766 Portal hypertension: Secondary | ICD-10-CM | POA: Diagnosis not present

## 2016-08-27 DIAGNOSIS — Z13810 Encounter for screening for upper gastrointestinal disorder: Secondary | ICD-10-CM | POA: Insufficient documentation

## 2016-08-27 HISTORY — PX: ESOPHAGOGASTRODUODENOSCOPY (EGD) WITH PROPOFOL: SHX5813

## 2016-08-27 HISTORY — PX: ESOPHAGEAL BANDING: SHX5518

## 2016-08-27 SURGERY — ESOPHAGOGASTRODUODENOSCOPY (EGD) WITH PROPOFOL
Anesthesia: Monitor Anesthesia Care

## 2016-08-27 MED ORDER — LIDOCAINE VISCOUS 2 % MT SOLN
15.0000 mL | Freq: Once | OROMUCOSAL | Status: AC
  Administered 2016-08-27: 3 mL via OROMUCOSAL

## 2016-08-27 MED ORDER — CHLORHEXIDINE GLUCONATE CLOTH 2 % EX PADS: 6.0000 | MEDICATED_PAD | Freq: Once | CUTANEOUS | Status: DC

## 2016-08-27 MED ORDER — FENTANYL CITRATE (PF) 100 MCG/2ML IJ SOLN
INTRAMUSCULAR | Status: AC
  Filled 2016-08-27: qty 2

## 2016-08-27 MED ORDER — LIDOCAINE VISCOUS 2 % MT SOLN
OROMUCOSAL | Status: AC
  Filled 2016-08-27: qty 15

## 2016-08-27 MED ORDER — LACTATED RINGERS IV SOLN
INTRAVENOUS | Status: DC
  Administered 2016-08-27: 09:00:00 via INTRAVENOUS

## 2016-08-27 MED ORDER — LIDOCAINE HCL (PF) 1 % IJ SOLN
INTRAMUSCULAR | Status: AC
  Filled 2016-08-27: qty 5

## 2016-08-27 MED ORDER — PROPOFOL 500 MG/50ML IV EMUL
INTRAVENOUS | Status: DC | PRN
  Administered 2016-08-27: 100 ug/kg/min via INTRAVENOUS

## 2016-08-27 MED ORDER — MIDAZOLAM HCL 2 MG/2ML IJ SOLN
INTRAMUSCULAR | Status: AC
  Filled 2016-08-27: qty 2

## 2016-08-27 MED ORDER — LIDOCAINE HCL (CARDIAC) 10 MG/ML IV SOLN
INTRAVENOUS | Status: DC | PRN
  Administered 2016-08-27: 50 mg via INTRAVENOUS

## 2016-08-27 MED ORDER — MIDAZOLAM HCL 5 MG/5ML IJ SOLN
INTRAMUSCULAR | Status: DC | PRN
  Administered 2016-08-27: 2 mg via INTRAVENOUS

## 2016-08-27 MED ORDER — PROPOFOL 10 MG/ML IV BOLUS
INTRAVENOUS | Status: DC | PRN
  Administered 2016-08-27 (×2): 20 mg via INTRAVENOUS

## 2016-08-27 MED ORDER — PROMETHAZINE HCL 25 MG/ML IJ SOLN
INTRAMUSCULAR | Status: AC
  Filled 2016-08-27: qty 1

## 2016-08-27 MED ORDER — FENTANYL CITRATE (PF) 100 MCG/2ML IJ SOLN
25.0000 ug | INTRAMUSCULAR | Status: AC
  Administered 2016-08-27: 25 ug via INTRAVENOUS

## 2016-08-27 MED ORDER — MIDAZOLAM HCL 2 MG/2ML IJ SOLN
1.0000 mg | INTRAMUSCULAR | Status: AC
  Administered 2016-08-27: 2 mg via INTRAVENOUS

## 2016-08-27 MED ORDER — PROPOFOL 10 MG/ML IV BOLUS
INTRAVENOUS | Status: AC
  Filled 2016-08-27: qty 20

## 2016-08-27 NOTE — Anesthesia Postprocedure Evaluation (Signed)
Anesthesia Post Note  Patient: Benjamin Miller  Procedure(s) Performed: Procedure(s) (LRB): ESOPHAGOGASTRODUODENOSCOPY (EGD) WITH PROPOFOL (N/A) ESOPHAGEAL BANDING (N/A)  Patient location during evaluation: PACU Anesthesia Type: MAC Level of consciousness: awake Pain management: satisfactory to patient Respiratory status: spontaneous breathing Cardiovascular status: stable Anesthetic complications: no     Last Vitals:  Vitals:   08/27/16 1018 08/27/16 1030  BP: 124/90 116/84  Pulse: (!) 58 (!) 58  Resp: 11 14  Temp: 36.6 C     Last Pain:  Vitals:   08/27/16 0859  TempSrc: Oral                 Willa Brocks

## 2016-08-27 NOTE — H&P (Signed)
 @   Primary Care Physician:  Frederich Chick, MD Primary Gastroenterologist:  Dr. Jena Gauss  Pre-Procedure History & Physical: HPI:  Benjamin Miller is a 41 y.o. male here for follow-up of esophageal varices. Status post banding previously. He's done well since last session. He is here for follow-up with him banding any residual varices per plan.  Past Medical History:  Diagnosis Date  . Chronic hepatitis C with cirrhosis (HCC)   . Esophageal varices determined by endoscopy (HCC)   . GERD (gastroesophageal reflux disease)   . Hepatitis C     Past Surgical History:  Procedure Laterality Date  . ESOPHAGEAL BANDING  04/26/2016   Procedure: ESOPHAGEAL BANDING;  Surgeon: Corbin Ade, MD;  [facility]: [facility];  Service: Endoscopy;;  . ESOPHAGOGASTRODUODENOSCOPY (EGD) WITH PROPOFOL N/A 03/19/2016   Procedure: ESOPHAGOGASTRODUODENOSCOPY (EGD) WITH PROPOFOL;  Surgeon: Corbin Ade, MD;  Location: [facility];  Service: Endoscopy;  Laterality: N/A;  with propofol  . ESOPHAGOGASTRODUODENOSCOPY (EGD) WITH PROPOFOL N/A 04/26/2016   Procedure: ESOPHAGOGASTRODUODENOSCOPY (EGD) WITH PROPOFOL;  Surgeon: Corbin Ade, MD;  Location: [facility];  Service: Endoscopy;  Laterality: N/A;  915     Prior to Admission medications   Medication Sig Start Date End Date Taking? Authorizing Provider  amphetamine-dextroamphetamine (ADDERALL) 15 MG tablet Take 15 mg by mouth See admin instructions. Take 1 tablet (15 mg) scheduled every morning, & take 1 tablet (15 mg) as needed in the afternoon.   Yes Historical Provider, MD  furosemide (LASIX) 20 MG tablet TAKE 1 TABLET BY MOUTH EVERY DAY 08/22/16  Yes Anice Paganini, NP  HARVONI 90-400 MG TABS Take 1 tablet by mouth daily.  07/09/16  Yes Historical Provider, MD  nadolol (CORGARD) 20 MG tablet Take 1 tablet (20 mg total) by mouth daily. Patient taking differently: Take 20 mg by mouth every evening.  04/18/16  Yes Anice Paganini, NP  spironolactone (ALDACTONE)  25 MG tablet Take 1 tablet (25 mg total) by mouth 2 (two) times daily. 04/18/16  Yes Anice Paganini, NP  pantoprazole (PROTONIX) 40 MG tablet Take 1 tablet (40 mg total) by mouth daily. Patient not taking: Reported on 08/17/2016 04/18/16   Anice Paganini, NP    Allergies as of 07/24/2016  . (No Known Allergies)    Family History  Problem Relation Age of Onset  . Colon cancer Neg Hx   . Liver cancer Neg Hx   . Liver disease Neg Hx     Social History   Social History  . Marital status: Single    Spouse name: N/A  . Number of children: N/A  . Years of education: N/A   Occupational History  . Not on file.   Social History Main Topics  . Smoking status: Current Every Day Smoker    Packs/day: 0.25    Years: 20.00    Types: Cigarettes    Start date: 04/05/1987  . Smokeless tobacco: Never Used  . Alcohol use No  . Drug use: Yes    Frequency: 7.0 times per week    Types: Marijuana  . Sexual activity: Yes    Birth control/ protection: None   Other Topics Concern  . Not on file   Social History Narrative  . No narrative on file    Review of Systems: See HPI, otherwise negative ROS  Physical Exam: BP 90/64   Pulse (!) 53   Temp 98 F (36.7 C) (Oral)   Resp 18  Ht 6' (1.829 m)   Wt 170 lb (77.1 kg)   BMI 23.06 kg/m  General:    well-nourished, pleasant and cooperative in NAD Neck:  Supple; no masses or thyromegaly. No significant cervical adenopathy. Lungs:  Clear throughout to auscultation.   No wheezes, crackles, or rhonchi. No acute distress. Heart:  Regular rate and rhythm; no murmurs, clicks, rubs,  or gallops. Abdomen: Non-distended, normal bowel sounds.  Soft and nontender without appreciable mass or hepatosplenomegaly.  Pulses:  Normal pulses noted. Extremities:  Without clubbing or edema.  Impression: 41 year old [place] HCV cirrhosis  - status post EBL treatment previously. He is here for follow-up of examination and will undergo banding of any residual  varices per plan. The risks, benefits, limitations, alternatives and imponderables have been reviewed with the patient. Potential for esophageal dilation, biopsy, etc. have also been reviewed.  Questions have been answered. All parties agreeable.     Notice: This dictation was prepared with Dragon dictation along with smaller phrase technology. Any transcriptional errors that result from this process are unintentional and may not be corrected upon review.

## 2016-08-27 NOTE — Transfer of Care (Signed)
Immediate Anesthesia Transfer of Care Note  Patient: Benjamin Miller  Procedure(s) Performed: Procedure(s) with comments: ESOPHAGOGASTRODUODENOSCOPY (EGD) WITH PROPOFOL (N/A) - 10:15am ESOPHAGEAL BANDING (N/A)  Patient Location: PACU  Anesthesia Type:MAC  Level of Consciousness: alert  and patient cooperative  Airway & Oxygen Therapy: Patient Spontanous Breathing and Patient connected to nasal cannula oxygen  Post-op Assessment: Report given to RN and Post -op Vital signs reviewed and stable  Post vital signs: Reviewed and stable  Last Vitals:  Vitals:   08/27/16 0945 08/27/16 0950  BP: 100/61   Pulse:    Resp: 19 17  Temp:      Last Pain:  Vitals:   08/27/16 0859  TempSrc: Oral      Patients Stated Pain Goal: 7 (16/60/60 0459)  Complications: No apparent anesthesia complications

## 2016-08-27 NOTE — Anesthesia Procedure Notes (Signed)
Procedure Name: MAC Date/Time: 08/27/2016 9:52 AM Performed by: Vista Deck Pre-anesthesia Checklist: Patient identified, Emergency Drugs available, Suction available, Timeout performed and Patient being monitored Patient Re-evaluated:Patient Re-evaluated prior to inductionOxygen Delivery Method: Non-rebreather mask

## 2016-08-27 NOTE — Discharge Instructions (Signed)
EGD Discharge instructions Please read the instructions outlined below and refer to this sheet in the next few weeks. These discharge instructions provide you with general information on caring for yourself after you leave the hospital. Your doctor may also give you specific instructions. While your treatment has been planned according to the most current medical practices available, unavoidable complications occasionally occur. If you have any problems or questions after discharge, please call your doctor. ACTIVITY  You may resume your regular activity but move at a slower pace for the next 24 hours.   Take frequent rest periods for the next 24 hours.   Walking will help expel (get rid of) the air and reduce the bloated feeling in your abdomen.   No driving for 24 hours (because of the anesthesia (medicine) used during the test).   You may shower.   Do not sign any important legal documents or operate any machinery for 24 hours (because of the anesthesia used during the test).  NUTRITION  Drink plenty of fluids.   You may resume your normal diet.   Begin with a light meal and progress to your normal diet.   Avoid alcoholic beverages for 24 hours or as instructed by your caregiver.  MEDICATIONS  You may resume your normal medications unless your caregiver tells you otherwise.  WHAT YOU CAN EXPECT TODAY  You may experience abdominal discomfort such as a feeling of fullness or gas pains.  FOLLOW-UP  Your doctor will discuss the results of your test with you.  SEEK IMMEDIATE MEDICAL ATTENTION IF ANY OF THE FOLLOWING OCCUR:  Excessive nausea (feeling sick to your stomach) and/or vomiting.   Severe abdominal pain and distention (swelling).   Trouble swallowing.   Temperature over 101 F (37.8 C).   Rectal bleeding or vomiting of blood.    Gradually advance diet as tolerated beginning with lunch today  Office visit with [place] in 3 months  Repeat EGD in 1 year

## 2016-08-27 NOTE — Op Note (Signed)
[facility] Patient Name: Benjamin Miller Procedure Date: 08/27/2016 9:42 AM MRN: [id number] Date of Birth: 1975/10/23 Attending MD: Gennette Pac , MD CSN: [id number] Age: 41 Admit Type: Outpatient Procedure:                Upper GI endoscopy with EBL Indications:              Surveillance procedure Providers:                Gennette Pac, MD, Judee Clara, RN,                            Burke Keels, Technician Referring MD:              Medicines:                Propofol per Anesthesia Complications:            No immediate complications. Estimated Blood Loss:     Estimated blood loss: none. Procedure:                Pre-Anesthesia Assessment:                           - Prior to the procedure, a History and Physical                            was performed, and patient medications and                            allergies were reviewed. The patient's tolerance of                            previous anesthesia was also reviewed. The risks                            and benefits of the procedure and the sedation                            options and risks were discussed with the patient.                            All questions were answered, and informed consent                            was obtained. Prior Anticoagulants: The patient has                            taken no previous anticoagulant or antiplatelet                            agents. ASA Grade Assessment: III - A patient with                            severe systemic disease. After reviewing the risks  and benefits, the patient was deemed in                            satisfactory condition to undergo the procedure.                           After obtaining informed consent, the endoscope was                            passed under direct vision. Throughout the                            procedure, the patient's blood pressure, pulse, and                            oxygen  saturations were monitored continuously. The                            [id number]) was introduced through the and                            advanced to the second part of duodenum. The                            patient tolerated the procedure well. The upper GI                            endoscopy was accomplished without difficulty. Scope In: 10:00:19 AM Scope Out: 10:10:45 AM Total Procedure Duration: 0 hours 10 minutes 26 seconds  Findings:      Grade II varices were found in the distal esophagus. (2)short columns       and one column approximately 6 cm in length. Four bands were       successfully placed with complete eradication, resulting in deflation of       varices. There was no bleeding during the procedure.      No other significant abnormalities were identified in a careful       examination of the esophagus.      Moderate portal hypertensive gastropathy was found in the stomach.      The duodenal bulb and second portion of the duodenum were normal. Impression:               - Esophageal varices. Completely eradicated. Banded.                           - No specimens collected. Moderate Sedation:      Moderate (conscious) sedation was personally administered by an       anesthesia professional. The following parameters were monitored: oxygen       saturation, heart rate, blood pressure, respiratory rate, EKG, adequacy       of pulmonary ventilation, and response to care. Total physician       intraservice time was 22 minutes. Recommendation:           - Patient has a contact number available for  emergencies. The signs and symptoms of potential                            delayed complications were discussed with the                            patient. Return to normal activities tomorrow.                            Written discharge instructions were provided to the                            patient.                           - Advance  diet as tolerated today.                           - Continue present medications.                           - Repeat upper endoscopy in 1 year for surveillance.                           - Return to GI office in 3 months. Procedure Code(s):        --- Professional ---                           [phone], Esophagogastroduodenoscopy, flexible,                            transoral; with band ligation of esophageal/gastric                            varices Diagnosis Code(s):        --- Professional ---                           I85.00, Esophageal varices without bleeding CPT copyright 2016 American Medical Association. All rights reserved. The codes documented in this report are preliminary and upon coder review may  be revised to meet current compliance requirements. [name]. Rourk, MD Gennette Pac, MD 08/27/2016 10:19:59 AM This report has been signed electronically. Number of Addenda: 0

## 2016-08-27 NOTE — Anesthesia Preprocedure Evaluation (Signed)
Anesthesia Evaluation  Patient identified by MRN, date of birth, ID band Patient awake    Reviewed: Allergy & Precautions, NPO status , Patient's Chart, lab work & pertinent test results  Airway Mallampati: I  TM Distance: >3 FB     Dental  (+) Poor Dentition, Chipped, Loose,    Pulmonary Current Smoker, former smoker,    breath sounds clear to auscultation       Cardiovascular negative cardio ROS   Rhythm:Regular Rate:Normal     Neuro/Psych    GI/Hepatic GERD  ,(+) Cirrhosis  (portal Htn, thrombocytopenia)      , Hepatitis -, CUGI bleed    Endo/Other    Renal/GU      Musculoskeletal   Abdominal   Peds  Hematology   Anesthesia Other Findings   Reproductive/Obstetrics                             Anesthesia Physical Anesthesia Plan  ASA: III  Anesthesia Plan: MAC   Post-op Pain Management:    Induction: Intravenous  Airway Management Planned: Simple Face Mask  Additional Equipment:   Intra-op Plan:   Post-operative Plan:   Informed Consent: I have reviewed the patients History and Physical, chart, labs and discussed the procedure including the risks, benefits and alternatives for the proposed anesthesia with the patient or authorized representative who has indicated his/her understanding and acceptance.     Plan Discussed with:   Anesthesia Plan Comments:         Anesthesia Quick Evaluation

## 2016-08-28 ENCOUNTER — Telehealth: Payer: Self-pay | Admitting: Internal Medicine

## 2016-08-28 ENCOUNTER — Other Ambulatory Visit: Payer: Self-pay | Admitting: Nurse Practitioner

## 2016-08-28 DIAGNOSIS — I8511 Secondary esophageal varices with bleeding: Secondary | ICD-10-CM

## 2016-08-28 DIAGNOSIS — R188 Other ascites: Principal | ICD-10-CM

## 2016-08-28 DIAGNOSIS — K7460 Unspecified cirrhosis of liver: Secondary | ICD-10-CM

## 2016-08-28 NOTE — Telephone Encounter (Signed)
noted 

## 2016-08-28 NOTE — Telephone Encounter (Signed)
[employer] called to say that the patient's harvoni would be delivered before noon on Friday.

## 2016-08-30 ENCOUNTER — Encounter (HOSPITAL_COMMUNITY): Payer: Self-pay | Admitting: Internal Medicine

## 2016-08-31 NOTE — Telephone Encounter (Signed)
pts medication arrived. I called and informed him. He said he couldn't get here before 12 today and he would pick it up on Monday. Medication at the front desk for him to pick up.

## 2016-09-24 ENCOUNTER — Telehealth: Payer: Self-pay | Admitting: *Deleted

## 2016-09-24 ENCOUNTER — Ambulatory Visit: Payer: BLUE CROSS/BLUE SHIELD | Admitting: Nurse Practitioner

## 2016-09-24 NOTE — Telephone Encounter (Signed)
Please advise 

## 2016-09-24 NOTE — Telephone Encounter (Signed)
PATIENT ON HIS LAST MONTH OF HARVONI AND WANTED TO KNOW IF THERE WAS BLOODWORK HE NEEDED TO HAVE DONE AND DID THE 4TH MONTH NEED TO BE ORDERED.

## 2016-09-27 NOTE — Telephone Encounter (Signed)
Called pt- NA- LMOM with information and [place] [facility] [place] [facility]. Reminded him of ov on 11/26/16.

## 2016-09-27 NOTE — Telephone Encounter (Signed)
He should have a total of 12 weeks of medication. The next labs are due 12 weeks (3 months) AFTER FINISHING Harvoni. He has an appointment already scheduled for about 1-2 months after finishing treatment and we can schedule his labs at that time.

## 2016-09-27 NOTE — Telephone Encounter (Incomplete)
He should have a total of 12 weeks of medication. The next labs are due 12 weeks (3 months) AFTER Benjamin Miller Harvoni

## 2016-09-29 ENCOUNTER — Telehealth: Payer: Self-pay | Admitting: Internal Medicine

## 2016-09-29 NOTE — Telephone Encounter (Signed)
Patient's wife called about Harvoni last evening. Has 6 tablets left. She was wondering about total duration of therapy. I recommended that patient continue taking harvoni. Will get with her the first of the week to clarify therapeutic plan.

## 2016-10-02 NOTE — Telephone Encounter (Signed)
Tried to call the wife, NA and No voicemail. Talked to the pt, told him that this completed his treatment and he is worried that the hep c is not gone. He said there has been some delays in his appointments and he doesn't want to wait until July to have his appt. I have moved him up to June 22 with EG. He took his last Harvoni today. We went over everything and he is aware of appt date and time.

## 2016-10-04 NOTE — Telephone Encounter (Signed)
Even if we see him sooner there's no indication to check his labs at that time. The definition of "cure" is called SVR-12 (meaning no virus detectable 12 weeks after finishing treatment).

## 2016-10-26 ENCOUNTER — Encounter: Payer: Self-pay | Admitting: Nurse Practitioner

## 2016-10-26 ENCOUNTER — Telehealth: Payer: Self-pay | Admitting: Nurse Practitioner

## 2016-10-26 ENCOUNTER — Ambulatory Visit: Payer: BLUE CROSS/BLUE SHIELD | Admitting: Nurse Practitioner

## 2016-10-26 NOTE — Telephone Encounter (Signed)
He has informed us he is going somewhere else. Let's make it official with a discharge letter

## 2016-10-26 NOTE — Telephone Encounter (Signed)
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

## 2016-10-26 NOTE — Telephone Encounter (Signed)
Noted  

## 2016-10-26 NOTE — Telephone Encounter (Signed)
Routing to Dr. Rourk  

## 2016-10-26 NOTE — Telephone Encounter (Signed)
Pt was a no show

## 2016-10-26 NOTE — Telephone Encounter (Signed)
Patient called and stated he was on his was and was told he had to be here by 15 after, he was late and I told him we had to reschedule.  He demanded to speak to dr Jena Gaussrourk and stated he wanted his records because he was going somewhere else.

## 2016-10-29 ENCOUNTER — Encounter: Payer: Self-pay | Admitting: General Practice

## 2016-10-29 NOTE — Telephone Encounter (Signed)
Discharge letter mailed  

## 2016-11-01 ENCOUNTER — Telehealth: Payer: Self-pay | Admitting: Gastroenterology

## 2016-11-01 NOTE — Telephone Encounter (Signed)
Dr. Russella DarStark is Doc of the Day for 11/01/16 PM. Patient states that he is wanting to transfer to our office because he says that he has "been late for a couple of appointment" and [facility] keeps rescheduling his appointments for a couple of months out.   Patient states that he has cirrhosis and Hep C. I informed him that if he was accepted as a patient that our office does not treat Hep C.   [facility] and placed on Dr. Ardell IsaacsStark's desk for review.

## 2016-11-02 NOTE — Telephone Encounter (Signed)
Dr. Russella DarStark reviewed records and has declined to accept patient. Dr Russella DarStark states "I don't feel we can offer him the care he currently receives"   I informed patient of this.

## 2016-11-26 ENCOUNTER — Ambulatory Visit: Payer: BLUE CROSS/BLUE SHIELD | Admitting: Nurse Practitioner

## 2016-12-14 ENCOUNTER — Other Ambulatory Visit: Payer: Self-pay | Admitting: Gastroenterology

## 2016-12-14 ENCOUNTER — Other Ambulatory Visit: Payer: Self-pay | Admitting: Nurse Practitioner

## 2016-12-21 ENCOUNTER — Other Ambulatory Visit: Payer: Self-pay

## 2016-12-21 NOTE — Telephone Encounter (Signed)
Opened in error

## 2017-05-13 IMAGING — US US ABDOMEN LIMITED
1 series · 14 of 25 positions shown · non-contrast
Comparison: CT of the abdomen on 03/17/2016

CLINICAL DATA: Cirrhosis, ascites, diarrhea, nausea, vomiting and
GI bleed.

EXAM:
US ABDOMEN LIMITED - RIGHT UPPER QUADRANT

[Series 1: us abdomen limited · 0.19mm/px · 14 of 40 slices shown]
[im 1/40]
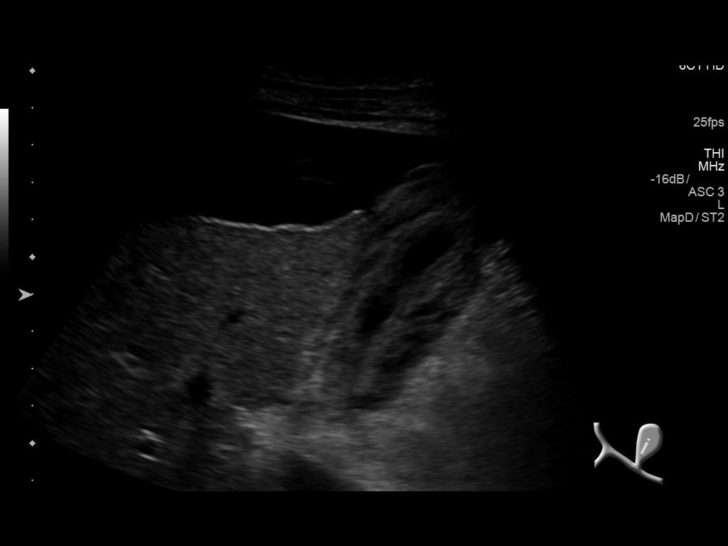
[im 4/40]
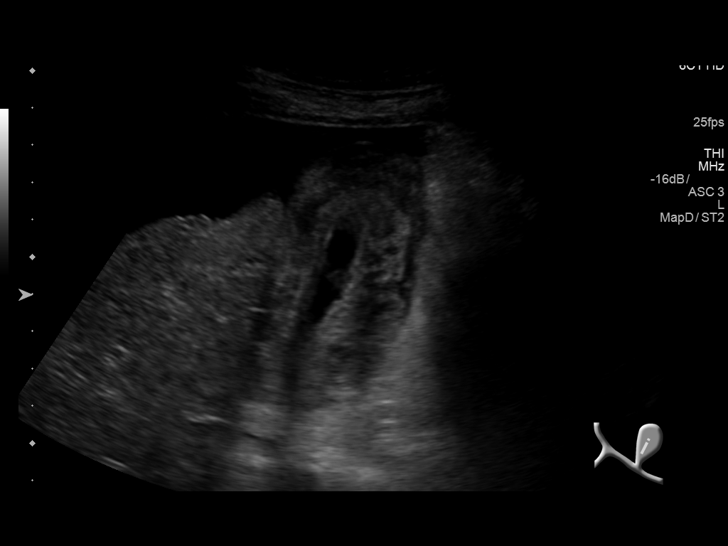
[im 7/40]
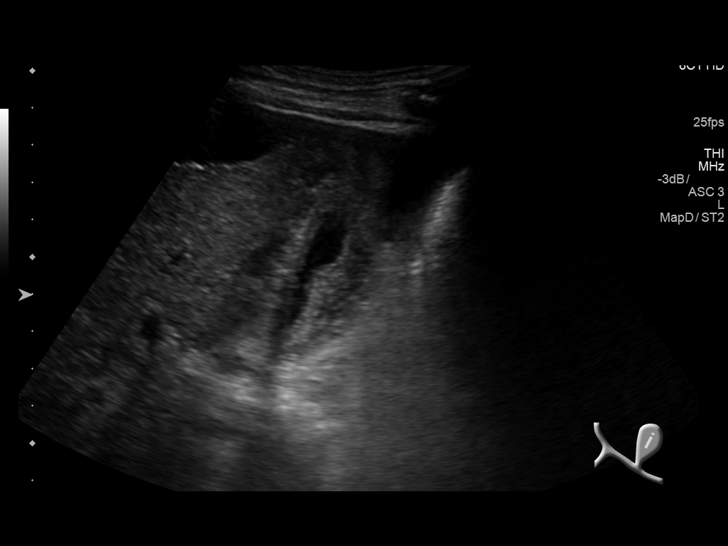
[im 10/40]
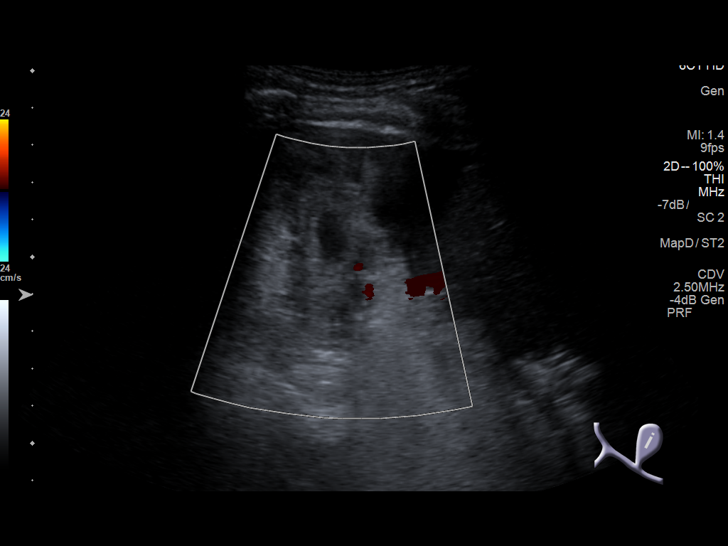
[im 14/40]
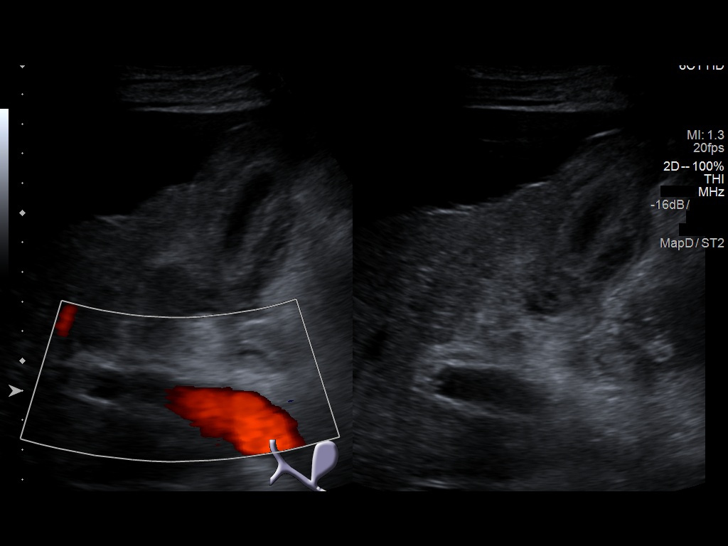
[im 15/40]
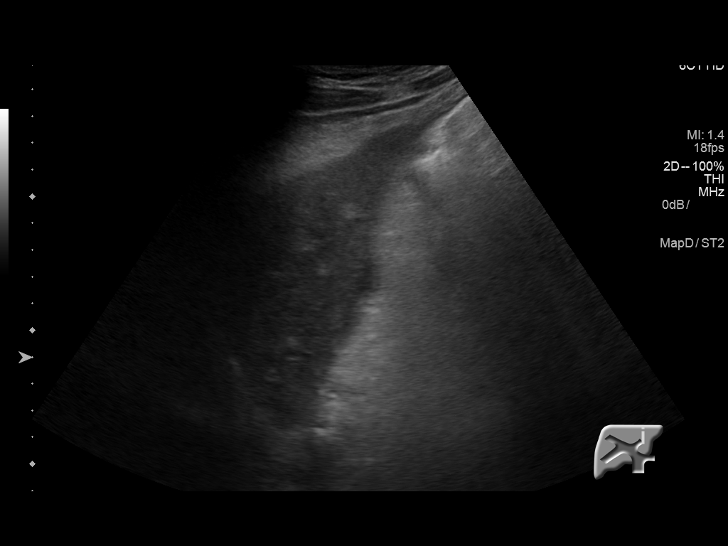
[im 18/40]
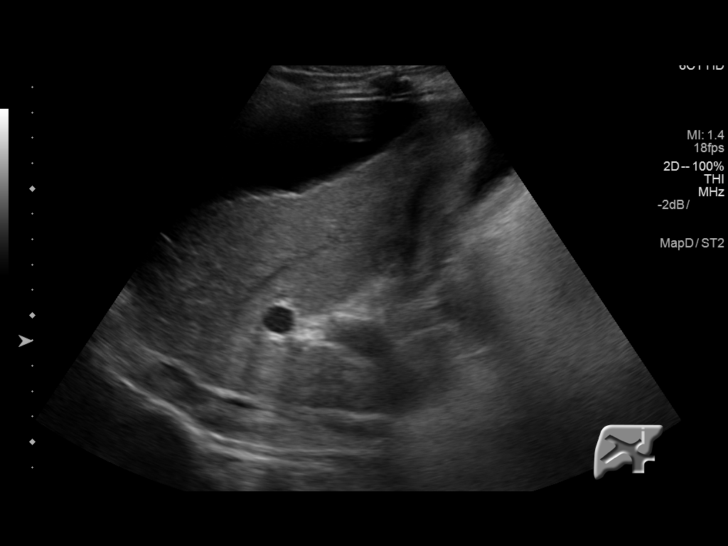
[im 22/40]
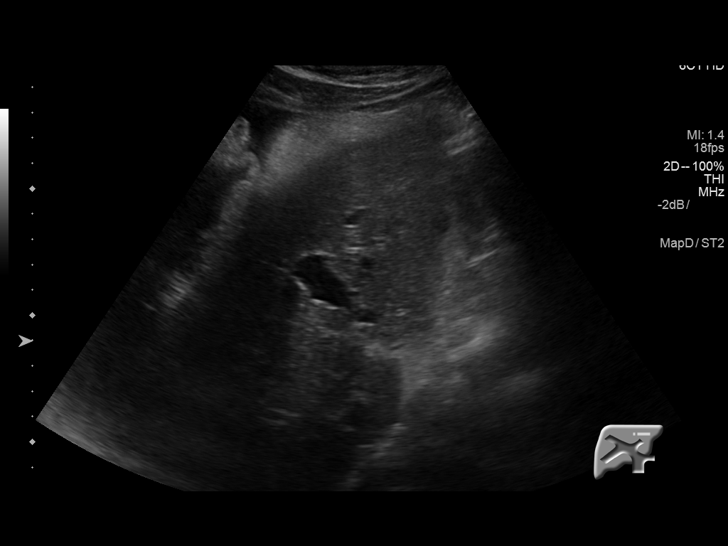
[im 25/40]
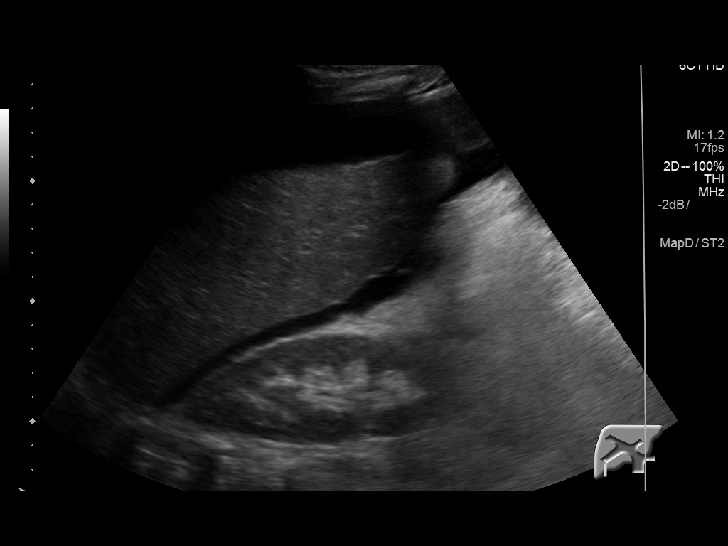
[im 27/40]
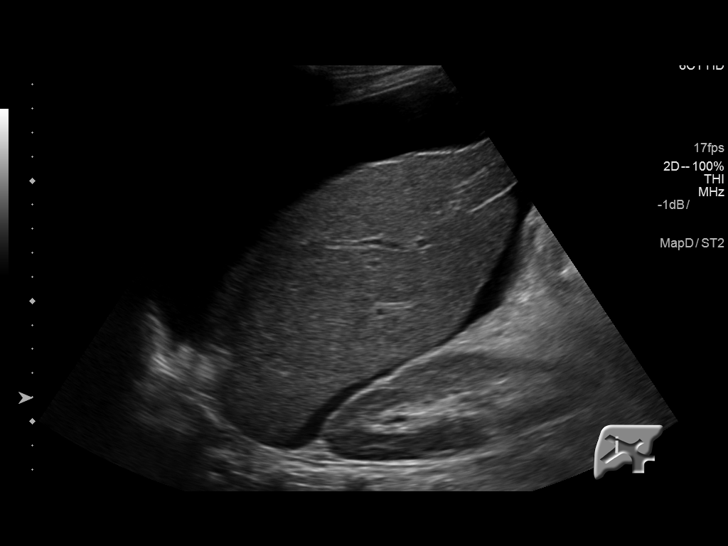
[im 30/40]
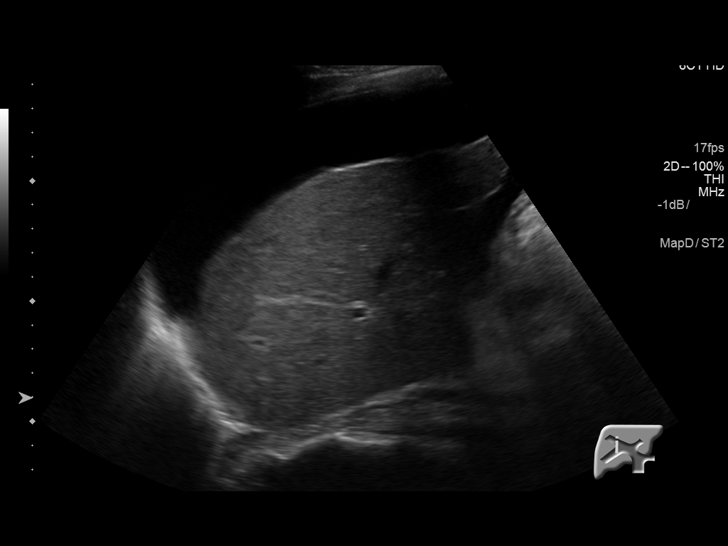
[im 33/40]
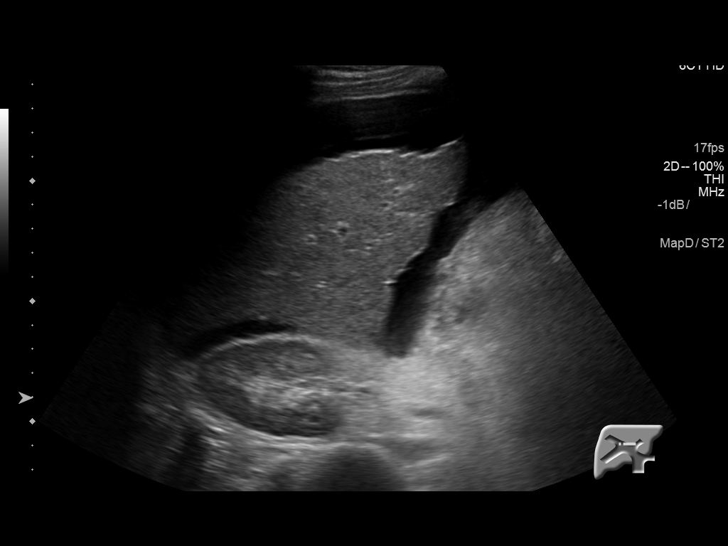
[im 36/40]
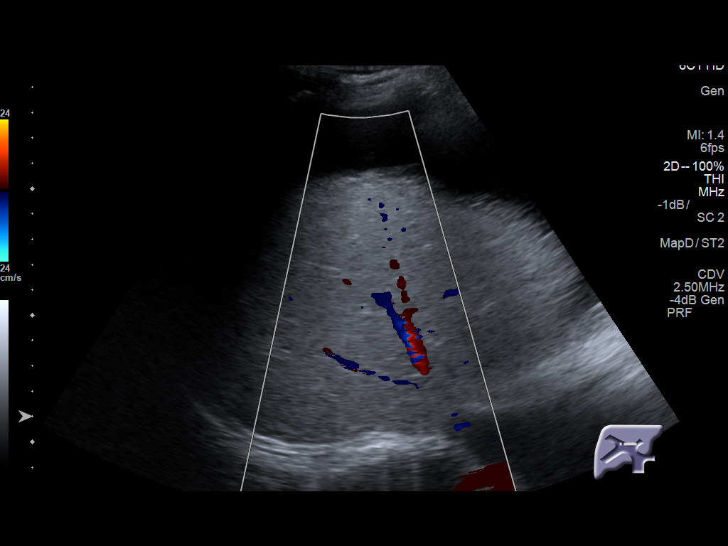
[im 40/40]
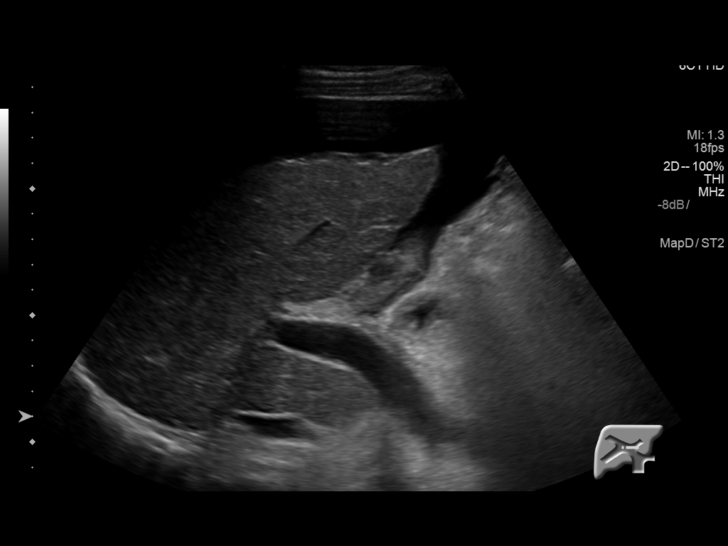

[14 of 25 positions shown; findings below may reference images not displayed]

FINDINGS: Gallbladder:

The gallbladder is relatively contracted and demonstrates diffuse
irregular wall thickening. Part of this appearance is likely
secondary to adjacent ascites and hypoalbuminemia. Component of
cholecystitis cannot be entirely excluded by ultrasound. However, no
sonographic Murphy's sign was elicited during the examination.

Common bile duct:

Diameter: 4 mm

Liver:

Overtly cirrhotic and echogenic liver with nodular surface contour.
The portal vein is open. No evidence of focal hepatic masses or
intrahepatic biliary ductal dilatation. Moderate ascites surrounds
the liver.
IMPRESSION: 1. Contracted and thickened gallbladder. Irregular wall thickening
may be secondary to ascites and hypoalbuminemia. No sonographic
Murphy's sign was elicited.
2. Cirrhosis of the liver with moderate ascites.

## 2017-05-14 IMAGING — CT CT ABD-PELV W/ CM
2 of 5 series · 16 of 46 positions shown, 18 images · IV contrast (APPLIED)
Comparison: None.

CLINICAL DATA: Patient with diarrhea, vomiting. History of
implanted.

EXAM:
CT ABDOMEN AND PELVIS WITH CONTRAST
TECHNIQUE: Multidetector CT imaging of the abdomen and pelvis was performed
using the standard protocol following bolus administration of
intravenous contrast.
CONTRAST:  30mL 59NVJQ-LQQ IOPAMIDOL (59NVJQ-LQQ) INJECTION 61%,
100mL 59NVJQ-LQQ IOPAMIDOL (59NVJQ-LQQ) INJECTION 61%

[Series 2: axial st · axial · 0.79mm/px · z∈[-482,-42]mm · 13 of 100 slices shown, 15 images]
[im 6/100  soft-tissue]
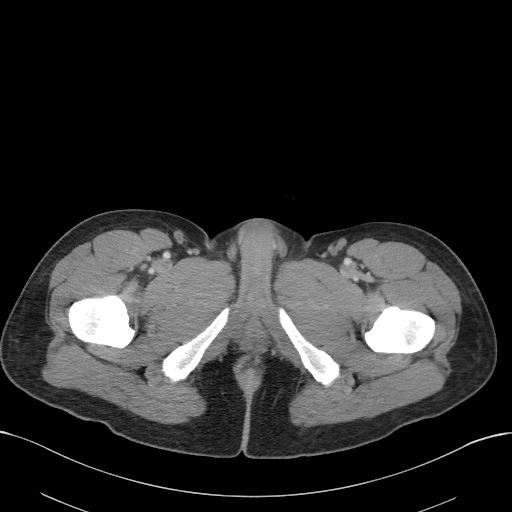
[im 6/100  bone]
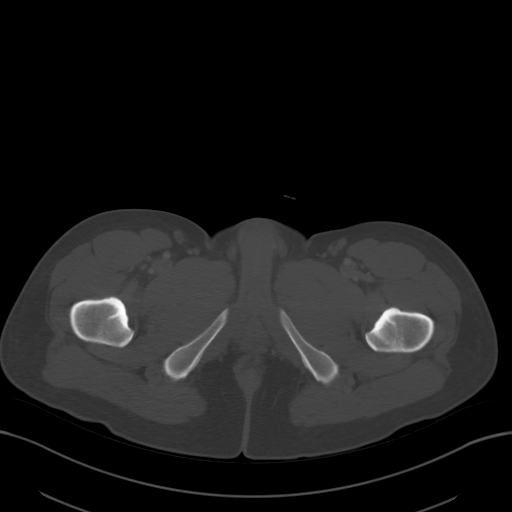
[im 16/100  soft-tissue]
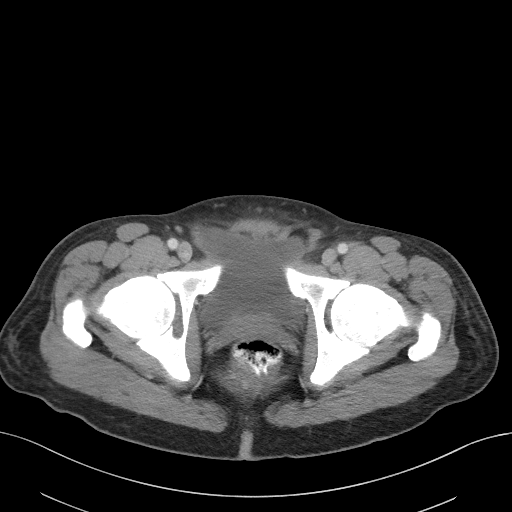
[im 21/100  soft-tissue]
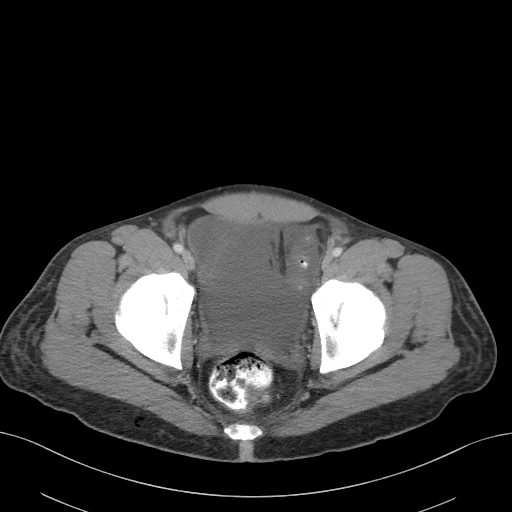
[im 27/100  soft-tissue]
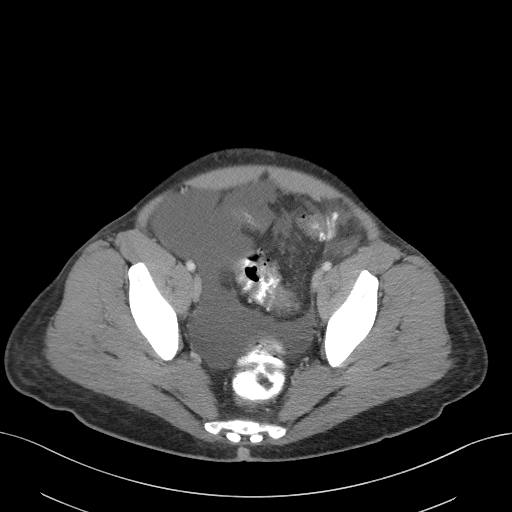
[im 37/100  soft-tissue]
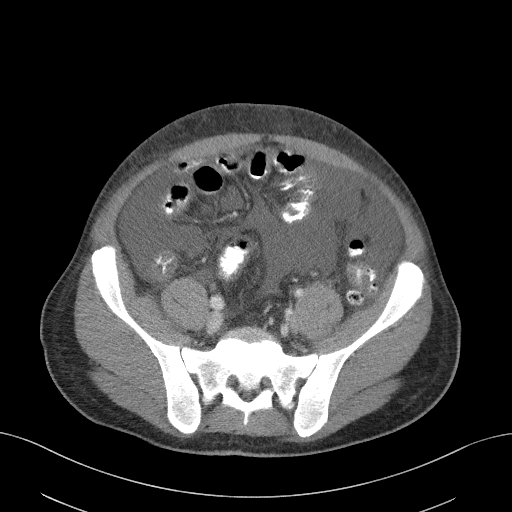
[im 42/100  soft-tissue]
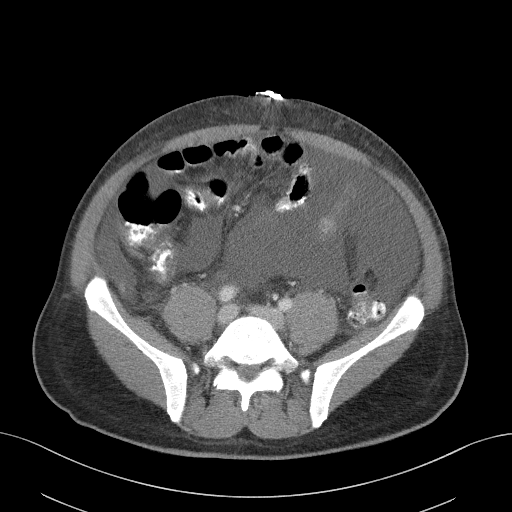
[im 53/100  soft-tissue]
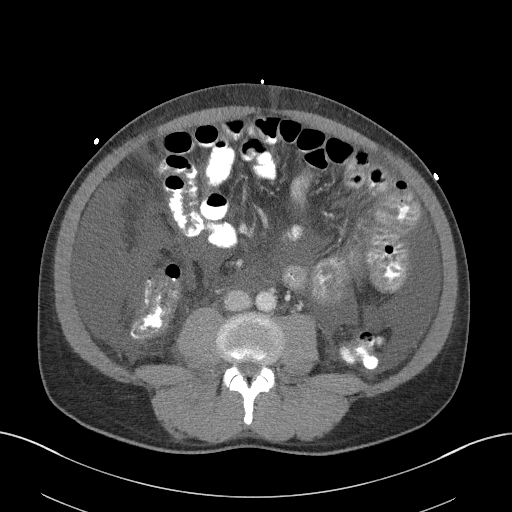
[im 58/100  soft-tissue]
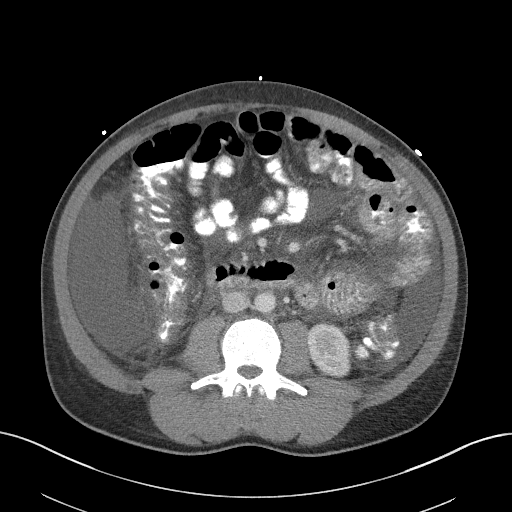
[im 63/100  soft-tissue]
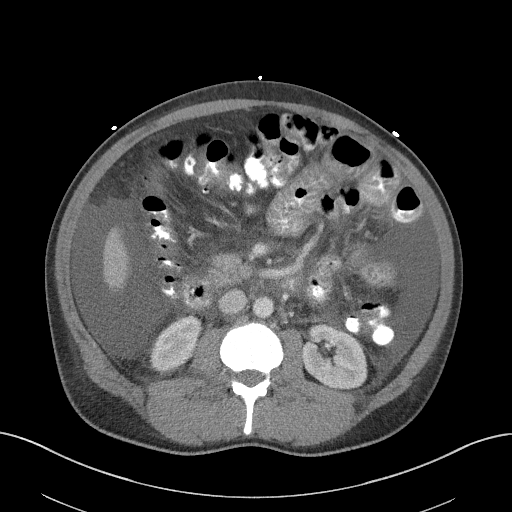
[im 63/100  bone]
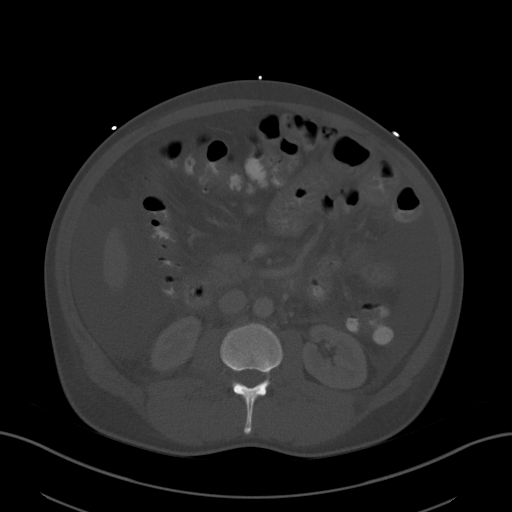
[im 73/100  soft-tissue]
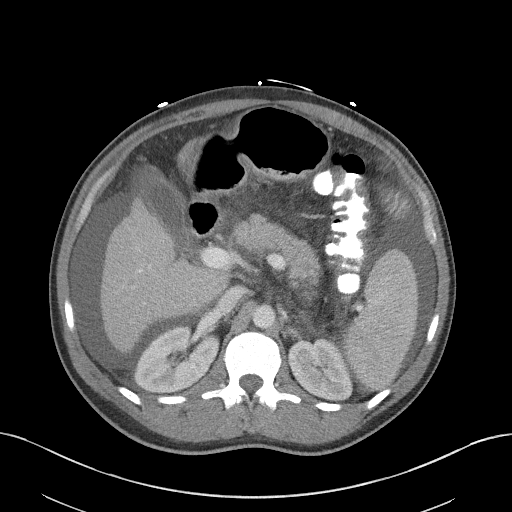
[im 79/100  soft-tissue]
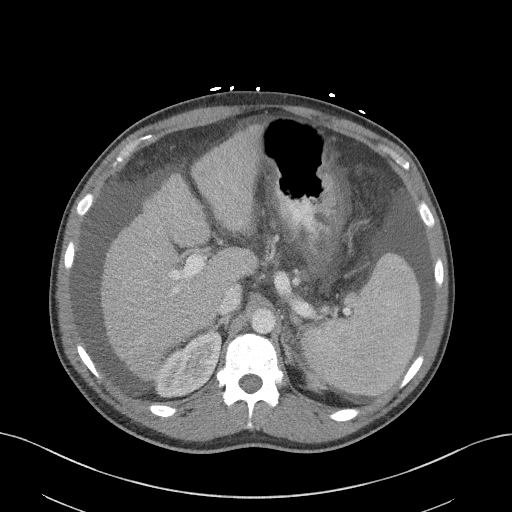
[im 84/100  soft-tissue]
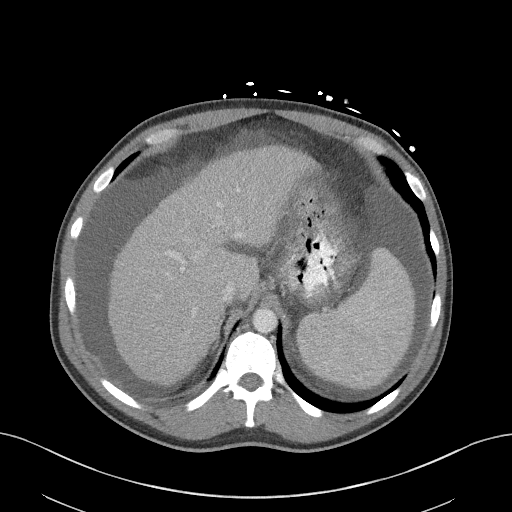
[im 94/100  soft-tissue]
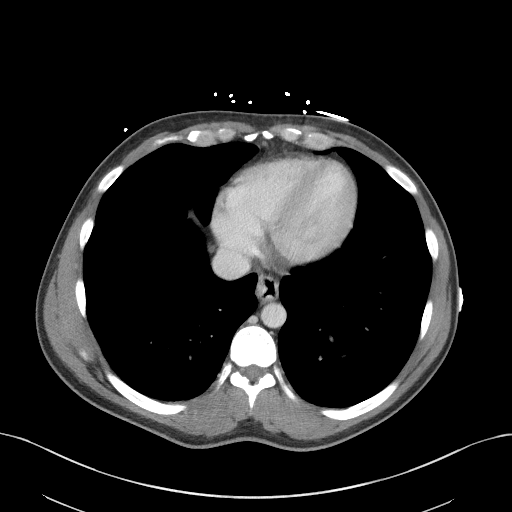

[Series 4: coronal st · coronal · 0.78mm/px · 3 of 105 slices shown]
[im 35/105  soft-tissue]
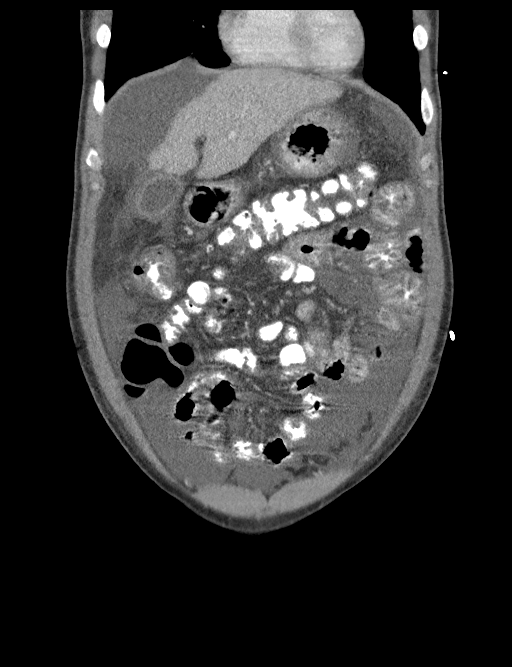
[im 47/105  soft-tissue]
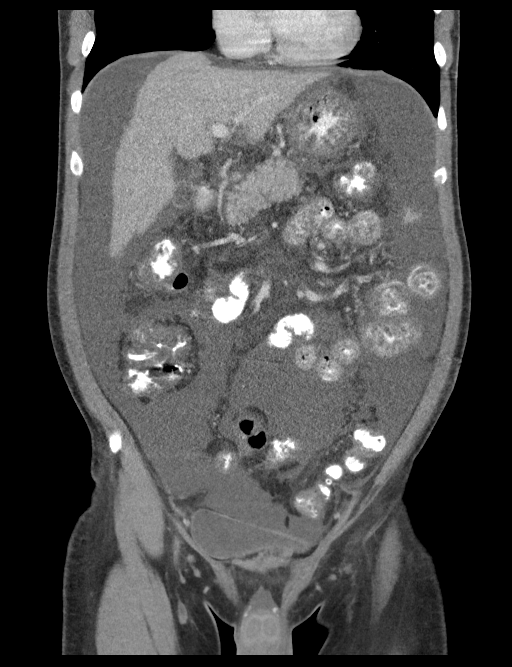
[im 58/105  soft-tissue]
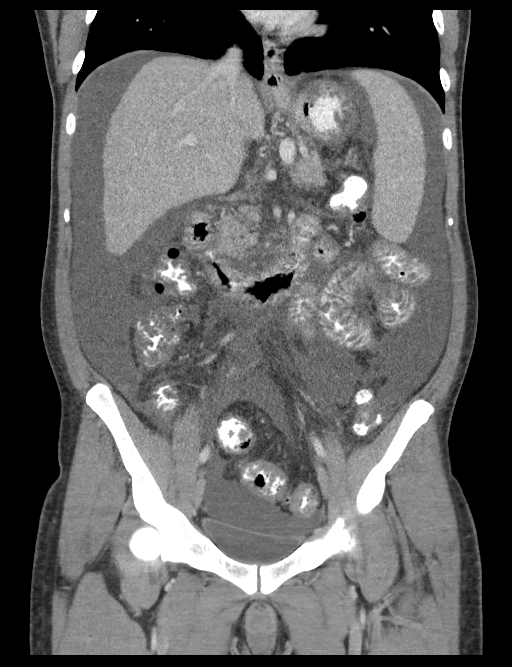

[16 of 46 positions shown; findings below may reference images not displayed]

FINDINGS: Lower chest: Normal heart size. No consolidative opacities within
the lower lobes bilaterally. No pleural effusion.

Hepatobiliary: Liver is small and nodular in contour compatible with
cirrhosis. Too small to characterize sub cm low-attenuation lesion
within the left hepatic lobe (image 15; series 2). Wall thickening
of the gallbladder. No intrahepatic or extrahepatic biliary ductal
dilatation.

Pancreas: Unremarkable

Spleen: Spleen is enlarged measuring 14 cm.

Adrenals/Urinary Tract: The adrenal glands are normal. There is a
1.9 cm cyst within the interpolar region the right kidney. Urinary
bladder is unremarkable.

Stomach/Bowel: Oral contrast material is demonstrated throughout the
small and large bowel without evidence for obstruction. There is
suggestion of wall thickening of the small bowel as well as
ascending colon, potentially secondary to portal colopathy and/or
low albumin.

Vascular/Lymphatic: Normal caliber abdominal aorta. No
retroperitoneal lymphadenopathy.

Reproductive: Prostate is mildly enlarged.

Other: There is moderate volume ascites throughout the abdomen.

Musculoskeletal: No aggressive or acute appearing osseous lesions.
IMPRESSION: Findings most compatible with hepatic cirrhosis and associated
splenomegaly.

There is moderate to large volume ascites throughout the abdomen.

Wall thickening of the small bowel and ascending colon is likely
secondary to portal hypertension and/or low albumin.

Gallbladder wall thickening likely secondary to hepatic parenchymal
disease. Recommend correlation with LFTs is clinically indicated.
